# Patient Record
Sex: Female | Born: 1975 | Race: White | Hispanic: No | Marital: Married | Smoking: Current every day smoker
Health system: Southern US, Community
[De-identification: ages and names within clinical notes are randomized; demographics above are authoritative.]

## PROBLEM LIST (undated history)

## (undated) DIAGNOSIS — R519 Headache, unspecified: Secondary | ICD-10-CM

## (undated) DIAGNOSIS — K2970 Gastritis, unspecified, without bleeding: Secondary | ICD-10-CM

## (undated) DIAGNOSIS — R221 Localized swelling, mass and lump, neck: Secondary | ICD-10-CM

## (undated) DIAGNOSIS — R51 Headache: Secondary | ICD-10-CM

## (undated) HISTORY — PX: TUBAL LIGATION: SHX77

## (undated) HISTORY — PX: ABDOMINAL HYSTERECTOMY: SHX81

## (undated) HISTORY — PX: ENDOMETRIAL ABLATION: SHX621

## (undated) HISTORY — PX: BLADDER SUSPENSION: SHX72

---

## 1997-09-08 ENCOUNTER — Other Ambulatory Visit: Admission: RE | Admit: 1997-09-08 | Discharge: 1997-09-08 | Payer: Self-pay | Admitting: Obstetrics and Gynecology

## 1998-03-04 ENCOUNTER — Inpatient Hospital Stay (HOSPITAL_COMMUNITY): Admission: AD | Admit: 1998-03-04 | Discharge: 1998-03-07 | Payer: Self-pay | Admitting: Obstetrics and Gynecology

## 1998-04-19 ENCOUNTER — Other Ambulatory Visit: Admission: RE | Admit: 1998-04-19 | Discharge: 1998-04-19 | Payer: Self-pay | Admitting: Obstetrics and Gynecology

## 1999-05-29 ENCOUNTER — Other Ambulatory Visit: Admission: RE | Admit: 1999-05-29 | Discharge: 1999-05-29 | Payer: Self-pay | Admitting: Obstetrics and Gynecology

## 2000-06-17 ENCOUNTER — Other Ambulatory Visit: Admission: RE | Admit: 2000-06-17 | Discharge: 2000-06-17 | Payer: Self-pay | Admitting: Obstetrics and Gynecology

## 2000-10-19 ENCOUNTER — Inpatient Hospital Stay (HOSPITAL_COMMUNITY): Admission: AD | Admit: 2000-10-19 | Discharge: 2000-10-19 | Payer: Self-pay | Admitting: Obstetrics and Gynecology

## 2000-11-22 ENCOUNTER — Inpatient Hospital Stay (HOSPITAL_COMMUNITY): Admission: AD | Admit: 2000-11-22 | Discharge: 2000-11-22 | Payer: Self-pay | Admitting: Obstetrics and Gynecology

## 2000-12-04 ENCOUNTER — Inpatient Hospital Stay (HOSPITAL_COMMUNITY): Admission: AD | Admit: 2000-12-04 | Discharge: 2000-12-04 | Payer: Self-pay | Admitting: *Deleted

## 2000-12-07 ENCOUNTER — Inpatient Hospital Stay (HOSPITAL_COMMUNITY): Admission: AD | Admit: 2000-12-07 | Discharge: 2000-12-07 | Payer: Self-pay | Admitting: Obstetrics and Gynecology

## 2000-12-09 ENCOUNTER — Inpatient Hospital Stay (HOSPITAL_COMMUNITY): Admission: AD | Admit: 2000-12-09 | Discharge: 2000-12-11 | Payer: Self-pay | Admitting: Obstetrics & Gynecology

## 2001-01-08 ENCOUNTER — Other Ambulatory Visit: Admission: RE | Admit: 2001-01-08 | Discharge: 2001-01-08 | Payer: Self-pay | Admitting: Obstetrics and Gynecology

## 2001-09-16 ENCOUNTER — Emergency Department (HOSPITAL_COMMUNITY): Admission: EM | Admit: 2001-09-16 | Discharge: 2001-09-16 | Payer: Self-pay | Admitting: Internal Medicine

## 2002-02-22 ENCOUNTER — Emergency Department (HOSPITAL_COMMUNITY): Admission: EM | Admit: 2002-02-22 | Discharge: 2002-02-22 | Payer: Self-pay | Admitting: *Deleted

## 2002-06-30 ENCOUNTER — Other Ambulatory Visit: Admission: RE | Admit: 2002-06-30 | Discharge: 2002-06-30 | Payer: Self-pay | Admitting: Obstetrics and Gynecology

## 2006-01-14 ENCOUNTER — Encounter: Admission: RE | Admit: 2006-01-14 | Discharge: 2006-01-14 | Payer: Self-pay | Admitting: Obstetrics and Gynecology

## 2006-02-17 ENCOUNTER — Encounter (INDEPENDENT_AMBULATORY_CARE_PROVIDER_SITE_OTHER): Payer: Self-pay | Admitting: *Deleted

## 2006-02-17 ENCOUNTER — Ambulatory Visit (HOSPITAL_COMMUNITY): Admission: RE | Admit: 2006-02-17 | Discharge: 2006-02-17 | Payer: Self-pay | Admitting: Obstetrics and Gynecology

## 2006-05-26 ENCOUNTER — Emergency Department (HOSPITAL_COMMUNITY): Admission: EM | Admit: 2006-05-26 | Discharge: 2006-05-26 | Payer: Self-pay | Admitting: Emergency Medicine

## 2008-08-24 ENCOUNTER — Ambulatory Visit (HOSPITAL_COMMUNITY): Admission: RE | Admit: 2008-08-24 | Discharge: 2008-08-25 | Payer: Self-pay | Admitting: Obstetrics and Gynecology

## 2008-08-24 ENCOUNTER — Encounter (INDEPENDENT_AMBULATORY_CARE_PROVIDER_SITE_OTHER): Payer: Self-pay | Admitting: Obstetrics and Gynecology

## 2009-10-13 ENCOUNTER — Emergency Department (HOSPITAL_COMMUNITY): Admission: EM | Admit: 2009-10-13 | Discharge: 2009-10-13 | Payer: Self-pay | Admitting: Emergency Medicine

## 2010-01-12 ENCOUNTER — Ambulatory Visit (HOSPITAL_COMMUNITY): Admission: RE | Admit: 2010-01-12 | Discharge: 2010-01-12 | Payer: Self-pay | Admitting: Family Medicine

## 2010-01-12 ENCOUNTER — Encounter: Payer: Self-pay | Admitting: Gastroenterology

## 2010-02-26 ENCOUNTER — Encounter (INDEPENDENT_AMBULATORY_CARE_PROVIDER_SITE_OTHER): Payer: Self-pay | Admitting: *Deleted

## 2010-06-19 NOTE — Letter (Signed)
Summary: Unable to Reach, Consult Scheduled  [facility]  [address]   [place], [place] [address]   Phone: [phone]  Fax: [phone]    02/26/2010  [name] [address] [place], [place]  [address] 12/11/1975   Dear Ms. [name],   We have been unable to reach you by phone.  Please contact our office with an updated phone number.  At the recommendation of DR Gerda Diss  we have been asked to schedule you a consult with DR Jena Gauss for ABDOMINAL PAIN/GASTRITIS AND NAUSEA.  Please call our office at [phone].     Thank you,    Diana Eves  [facility] R. Roetta Sessions, M.D.    Jonette Eva, M.D. Lorenza Burton, FNP-BC    Tana Coast, PA-C Phone: [phone]    Fax: [phone]

## 2010-08-29 LAB — CBC
HCT: 37.4 % (ref 36.0–46.0)
Hemoglobin: 9.9 g/dL — ABNORMAL LOW (ref 12.0–15.0)
MCHC: 34 g/dL (ref 30.0–36.0)
MCHC: 34.8 g/dL (ref 30.0–36.0)
Platelets: 148 10*3/uL — ABNORMAL LOW (ref 150–400)
Platelets: 199 10*3/uL (ref 150–400)
RDW: 12 % (ref 11.5–15.5)
RDW: 12.6 % (ref 11.5–15.5)

## 2010-10-02 NOTE — Op Note (Signed)
[name], Fernandez             ACCOUNT NO.:  [id number]   MEDICAL RECORD NO.:  [id number]          PATIENT TYPE:  INP   LOCATION:  9317                          FACILITY:  [facility]   PHYSICIAN:  Guy Sandifer. Henderson Cloud, M.D. DATE OF BIRTH:  [birth year]   DATE OF PROCEDURE:  08/24/2008  DATE OF DISCHARGE:                               OPERATIVE REPORT   PREOPERATIVE DIAGNOSES:  1. Pelvic pain.  2. Stress urinary incontinence.   POSTOPERATIVE DIAGNOSES:  1. Pelvic pain.  2. Stress urinary incontinence.   PROCEDURE:  Laparoscopically-assisted vaginal hysterectomy and  transobturator mid urethral sling.   SURGEON:  Guy Sandifer. Henderson Cloud, MD   ASSISTANT:  Zelphia Cairo, MD   ANESTHESIA:  General with endotracheal intubation.   SPECIMENS:  Uterus and bilateral Filshie clips to Pathology.   ESTIMATED BLOOD LOSS:  100 mL.   INDICATIONS AND CONSENT:  This patient is a 34 year old married white  female G3, P2, status post tubal ligation with pelvic pain and stress  urinary incontinence.  Details are dictated in the history and physical.  Laparoscopically-assisted vaginal hysterectomy, removal of tube and  ovary if abnormal, and transobturator mid urethral sling has been  discussed preoperatively.  Potential risks and complications have been  discussed preoperatively including, but not limited to infection, organ  damage, bleeding requiring transfusion of blood products with HIV and  hepatitis acquisition, DVT, PE, pneumonia, fistula formation,  laparotomy.  Success and failure rates of the sling, recurrent urinary  incontinence, postoperative irritative voiding symptoms, pelvic pain,  dyspareunia, delayed healing, erosion, return to the operating room,  catheterization, prolonged self-catheterization have all been discussed  preoperatively.  All questions have been answered and consent was signed  on the chart.   FINDINGS:  Upper abdomen is grossly normal.  Uterus is about 6 weeks in  size, smooth and contour.  Anterior and posterior cul-de-sacs are  normal.  Ovaries are normal bilaterally.  Fallopian tubes status post  ligation with Filshie clips.  Both Filshie clips are noted.   PROCEDURE IN DETAIL:  The patient was taken to the operating room where  she was identified, placed in dorsosupine position and general  anesthesia was induced via endotracheal intubation.  She was then placed  in the dorsal lithotomy position where she was prepped abdominally and  vaginally.  Bladder straight catheterized.  The anterior cervical lip  was grasped with single-tooth tenaculum.  The Hulka tenaculum will not  pass through the internal cervical os.  Therefore, the single-tooth  tenaculum was left in place.  The patient was draped in a sterile  fashion.  Time-out was carried out.  The infraumbilical and suprapubic  areas and later the left lower quadrant was infiltrated with 1% plain  Xylocaine.  A small infraumbilical incision was made and a disposable  Veress needle was placed on the first attempt without difficulty.  Placement was verified with a syringe and drop test.  Two liters of gas  were then insufflated under low pressure with good tympany in the right  upper quadrant.  Veress needle was removed.  A 10/11 Xcel bladeless  disposable trocar sleeve  was placed using direct visualization with the  diagnostic laparoscope.  After placement, the operative laparoscope was  used.  The suprapubic incision and later the left lower quadrant  incisions were made after careful transillumination.  A 5-mm Xcel  bladeless disposable trocar sleeves were both placed under direct  visualization without difficulty.  The above findings were noted.  Using  a grasper to manipulate the uterine fundus and a second grasper to  manipulate the adnexa, the Filshie clips were easily removed using the  EnSeal bipolar cautery cutting device.  They were delivered separately  and sent to Pathology.  The  proximal ligaments were then taken down  incorporating the proximal segment of the fallopian tube bilaterally and  it was carried across the utero-ovarian ligament, round ligament down to  the level of the vesicouterine peritoneum bilaterally.  Good hemostasis  was maintained.  A small incision was made in the center portion of the  vesicouterine peritoneum.  The suprapubic and left lower quadrant trocar  sleeves were removed.  The instruments were removed and attention was  turned to the pelvis.  Posterior cul-de-sacs entered sharply and the  cervix was circumscribed with unipolar cautery.  Mucosa was advanced  sharply and bluntly.  Then using the LigaSure bipolar cautery device,  the uterosacral ligaments were taken down, followed by the bladder  pillars, the cardinal ligaments, and uterine vessels.  The anterior cul-  de-sac was entered without difficulty.  Specimens delivered without  difficulty.  All suture will be 0 Monocryl unless otherwise designated.  Uterosacral ligaments were plicated vaginal cuff bilaterally.  They were  then plicated in the midline with a third suture.  Cuff was closed with  figure-of-eights.  The mid urethral sling was then undertaken.  The  points for the obturator incision were marked bilaterally after careful  palpation with a marking pen.  These areas were injected with 1% plain  Xylocaine.  Foley catheter was placed.  The bladder was drained and the  catheter was left indwelling.  The infraumbilical portion of the vaginal  mucosa was also injected with the same solution.  A small infraumbilical  incision was made and dissection was carried out bilaterally to the  urogenital diaphragm.  Then using the Halo needles from the Obtryx kit,  they were passed bilaterally through the obturator foramen with the  passage of the needle tip guided with the examining finger.  Both were  exited below the urethra.  Foley catheter was then removed and  cystoscopy was  carried out with 70-degree cystoscope.  A 360-degree  inspection reveals no evidence of perforation, no foreign body.  A good  puff of urine was noted bilaterally from the ureters.  Cystoscope was  removed.  Foley catheter was replaced.  The bladder was drained and the  catheter was left in place.  The polypropylene mesh sling was then  placed on the needle tips and withdrawn through the incisions  bilaterally.  Sheath was removed intact.  Tension was adjusted so that  the sling is laying perfectly flat.  There was no tension on the  suburethral portion of the sling and a pickups placed below the sling  can easily be rotated perpendicular to the floor with zero tension.  The  vaginal mucosa was closed in running locking fashion with 2-0 Vicryl  suture.  Excess sling was trimmed at the level of the skin and this  incision to close with Dermabond.  Attention was returned to the  abdomen.  Pneumoperitoneum was reintroduced and the suprapubic trocar  sleeve was reintroduced under direct visualization.  Irrigation was  carried out and careful inspection reveals good hemostasis all around.  Inspection under reduced pneumoperitoneum confirms this.  Excess fluid  was removed.  An additional 10 mL of 1% plain Xylocaine was then  instilled into the peritoneal cavity.  All instruments were removed.  The skin incisions were closed with subcuticular 3-0 Vicryl suture on  the umbilical and suprapubic incisions.  All 3 incisions were closed  with Dermabond.  All counts were correct.  The patient was awake and  taken to the recovery room in stable condition.       Guy Sandifer Henderson Cloud, M.D.  Electronically Signed    JET/MEDQ  D:  08/24/2008  T:  08/24/2008  Job:  [id number]

## 2010-10-02 NOTE — Discharge Summary (Signed)
NAMECINDI, GHAZARIAN             ACCOUNT NO.:  [id number]   MEDICAL RECORD NO.:  [id number]          PATIENT TYPE:  INP   LOCATION:  9317                          [facility]   PHYSICIAN:  Guy Sandifer. Henderson Cloud, M.D. DATE OF BIRTH:  1976/04/09   DATE OF ADMISSION:  08/24/2008  DATE OF DISCHARGE:  08/25/2008                               DISCHARGE SUMMARY   ADMITTING DIAGNOSES:  1. Pelvic pain.  2. Stress urinary incontinence.   DISCHARGE DIAGNOSES:  1. Pelvic pain.  2. Stress urinary incontinence.   PROCEDURES:  On August 24, 2008, laparoscopically assisted vaginal  hysterectomy and transobturator midurethral sling.   REASON FOR ADMISSION:  This patient is 35 year old married white female  G3, P2, status post tubal ligation with increasing pelvic pain and  symptoms of stress incontinence.  Details dictated in the history and  physical.  She is admitted for surgical management.   HOSPITAL COURSE:  The patient is admitted to the hospital, undergoes the  above procedure.  That evening, she has some hip pain, but otherwise no  real pelvic pain.  She is tolerating regular diet and ambulating well.  Vital signs are stable.  She is afebrile with clear urine output.  On  morning of the first postoperative day, she continues to ambulate well,  tolerating regular diet.  She remains afebrile with stable vital signs.  She voids 400 mL with 0 mL residual.  Hemoglobin is 9.9.  Abdomen is  soft with good bowel sounds.   CONDITION ON DISCHARGE:  Good.   DIET:  Regular as tolerated.   ACTIVITY:  No lifting, no operation of automobiles, and no vaginal  entry.  She is to call the office for problems including, not limited to  temperature of 101 degrees, heavy vaginal bleeding, increasing pain, or  persistent nausea, vomiting.   MEDICATIONS:  1. Percocet 5/325 mg #40 one to two p.o. q.6 h. p.r.n.  2. Ibuprofen 600 mg q.6 h. p.r.n.  3. Tandem iron supplement #30, 1 p.o. daily.   FOLLOWUP:   In the office in 2 weeks.      Guy Sandifer Henderson Cloud, M.D.  Electronically Signed     JET/MEDQ  D:  08/25/2008  T:  08/26/2008  Job:  [id number]

## 2010-10-05 NOTE — H&P (Signed)
NAMEMATRICIA, BEGNAUD             ACCOUNT NO.:  [id number]   MEDICAL RECORD NO.:  [id number]          PATIENT TYPE:  AMB   LOCATION:  SDC                           [facility]   PHYSICIAN:  Guy Sandifer. Henderson Cloud, M.D. DATE OF BIRTH:  March 23, 1976   DATE OF ADMISSION:  DATE OF DISCHARGE:                                HISTORY & PHYSICAL   CHIEF COMPLAINT:  Heavy painful menses and desires permanent sterilization.   HISTORY OF PRESENT ILLNESS:  This patient is a 35 year old, married, white  female, G3, P2 who is having menses every 22 to 27 days.  She is passing  golf ball size clots for 4 days at a time.  Changes tampons and pads every 1-  2 hours.  She is also having increasingly heavy cramping with this as well.  Finally, she wants permanent sterilization.  After careful discussion of the  options, she is being admitted for laparoscopy with tubal ligation,  hysteroscopy, D&C, and endometrial ablation.  Potential risks and  complications, success, and failure rates have been discussed  preoperatively.   PAST MEDICAL HISTORY:  Fractured femur as a child.   PAST SURGICAL HISTORY:  Negative.   OBSTETRIC HISTORY:  Miscarriage x1.  Vaginal delivery x2.   FAMILY HISTORY:  Heart disease in paternal grandfather.  Hypertension  paternal grandfather.  Varicosities paternal grandmother.  Diabetes father,  paternal grandmother, and maternal uncle.  Gestational diabetes in sister.  Colon and uterine cancer in maternal grandmother.   MEDICATIONS:  Over-the-counter allergy medications.   ALLERGIES:  No known drug allergies.   SOCIAL HISTORY:  Tobacco, smokes half a pack a day.  Denies alcohol or drug  abuse.   REVIEW OF SYSTEMS:  NEURO:  Has occasional headache.  CARDIAC:  Denies chest pain.  PULMONARY:  Denies shortness of breath.  GI:  Denies recent changes in bowel habits.   PHYSICAL EXAMINATION:  VITAL SIGNS:  Height 5 feet 4 inches, weight 123-1/2  pounds, blood pressure 114/72.  HEENT:  Without thyromegaly.  LUNGS:  Clear to auscultation.  HEART:  Regular rate and rhythm.  BACK:  No CVA tenderness.  BREASTS:  Without mass, traction, or discharge.  ABDOMEN:  Soft and nontender without masses.  PELVIC: __________  cervix without lesion.  Uterus retroverted, upper normal  size,  __________  adnexa nontender without masses.  EXTREMITIES:  Grossly within normal limits.  NEUROLOGICAL:  Grossly within normal limits.   ASSESSMENT:  Dysmenorrhea, metrorrhagia, desires permanent stabilization.   PLAN:  Laparoscopy with bilateral tubal ligation, hysteroscopy, D&C,  endometrial ablation.      Guy Sandifer Henderson Cloud, M.D.  Electronically Signed     JET/MEDQ  D:  02/13/2006  T:  02/13/2006  Job:  [id number]

## 2010-10-05 NOTE — H&P (Signed)
NAMEJULLIE, [name]                  ACCOUNT NO.:  [id number]   MEDICAL RECORD NO.:  [id number]          PATIENT TYPE:  AMB   LOCATION:                                [facility]   PHYSICIAN:  Guy Sandifer. Henderson Cloud, M.D. DATE OF BIRTH:  1976/01/07   DATE OF ADMISSION:  08/24/2008  DATE OF DISCHARGE:                              HISTORY & PHYSICAL   This Dr., and healing   CHIEF COMPLAINT:  Pelvic pain and leaking urine.   HISTORY OF PRESENT ILLNESS:  This patient is a 35 year old married white  female G3, P2 status post tubal ligation, status post endometrial  ablation with known endometriosis who has daily pelvic pain and monthly  exacerbations.  She also has bilateral low back pain with this.  She is  unable to have intercourse secondary to discomfort.  She also leaks  urine with coughing and laughing.  Ultrasound in my office on June 30, 2008, revealed uterus measuring 6.6 x 2.6 x 4.6 cm.  There is a  fluid collection probably secondary to hematocolpos  status post  ablation.  Urodynamics is consistent with stress urinary incontinence.  After discussion of options, she is being admitted for laparoscopically-  assisted vaginal hysterectomy and mid urethral sling.  Potential risks  and complications have been discussed preoperatively.   PAST MEDICAL HISTORY:  1. Fractured femur as a child.  2. Endometriosis.   PAST SURGICAL HISTORY:  Laparoscopies for endometriosis and endometrial  ablation.   OBSTETRIC HISTORY:  Miscarriage x1, vaginal delivery x2.   FAMILY HISTORY:  Heart disease in paternal grandfather, hypertension in  paternal grandfather, varicosities in paternal grandmother, diabetes in  father, paternal grandmother, maternal uncle, gestational diabetes in  sister, colon and uterine cancer in maternal grandmother.   MEDICATIONS:  Clarinex and Vicodin p.r.n.   ALLERGIES:  No known drug allergies.   SOCIAL HISTORY:  Denies tobacco, alcohol, or drug abuse.   REVIEW OF SYSTEMS:  NEURO:  Denies headache.  CARDIAC:  Denies chest  pain.  PULMONARY:  Denies shortness of breath.   PHYSICAL EXAMINATION:  VITAL SIGNS:  Height 5 feet 4-1/2 inches, weight  158 pounds, blood pressure 100/70.  HEENT:  Without thyromegaly.  LUNGS:  Clear to auscultation.  HEART:  Regular rate and rhythm.  BACK:  Without CVA tenderness.  ABDOMEN:  Soft, nontender without masses.  PELVIC:  Vulva, vagina, and cervix without lesion.  Uterus is  retroverted, tender over the left uterosacral ligament.  Left adnexa  without palpable masses.  Right adnexa nontender without masses.  EXTREMITIES:  Grossly within normal limits.  NEUROLOGIC:  Grossly within normal limits.   ASSESSMENT:  Pelvic pain and stress incontinence.   PLAN:  Laparoscopically-assisted vaginal hysterectomy and mid urethral  sling.      Guy Sandifer Henderson Cloud, M.D.  Electronically Signed     JET/MEDQ  D:  08/05/2008  T:  08/06/2008  Job:  [id number]

## 2010-10-05 NOTE — Op Note (Signed)
[name], [name]             ACCOUNT NO.:  [id number]   MEDICAL RECORD NO.:  [id number]          PATIENT TYPE:  AMB   LOCATION:  SDC                           [facility]   PHYSICIAN:  Guy Sandifer. Henderson Cloud, M.D. DATE OF BIRTH:  25-Mar-1976   DATE OF PROCEDURE:  02/17/2006  DATE OF DISCHARGE:                                 OPERATIVE REPORT   PREOPERATIVE DIAGNOSIS:  1. Dysmenorrhea.  2. Menorrhagia.  3. Desires permanent sterilization.   POSTOPERATIVE DIAGNOSIS:  1. Menorrhagia.  2. Desires permanent sterilization.  3. Endometriosis.   PROCEDURE:  1. Hysteroscopy with dilatation curettage.  2. NovaSure endometrial ablation.  3. Laparoscopy with bilateral tubal ligation with Filshie clips.  4. Ablation of endometriosis.  5. A 1% Xylocaine paracervical block.   SURGEON:  Guy Sandifer. Henderson Cloud, M.D.   ANESTHESIA:  General with endotracheal intubation by Belva Agee, M.D.   ESTIMATED BLOOD LOSS:  Minimal.   SPECIMEN:  Endometrial curettings to pathology.   INPUTS AND OUTPUTS:  Sorbitol distending media 150 mL deficit with some of  that being on the floor.   INDICATIONS AND CONSENT:  This patient is A 35 year old married white female  G3, P2 with increasingly heavy painful menses who desires permanent  sterilization.  Details have been dictated on the history and physical.  Laparoscopy with tubal ligation, hysteroscopy, D&C, and endometrial ablation  have been discussed with the patient preoperatively.  The potential risks  and complications have been reviewed preoperatively; including, but limited  to infection, uterine perforation, organ damage, bleeding requiring  transfusion of blood products with possible transfusion reaction, HIV and  hepatitis acquisition, DVT, PE, pneumonia.  The permanence of tubal  ligation, alternative methods of contraception, failure rate, and increased  ectopic risk have been reviewed preoperatively.  Success and failure rates  of  endometrial ablation have also been reviewed preoperatively.  All  questions have been answered and consent is on the chart.   FINDINGS:  Endometrial canal is without abnormal lesion.  Both fallopian  tube ostia are identified.  Abdominally upper abdomen is grossly normal.  Appendix was normal.  Anterior cul-de-sac is normal.  Tubes and ovaries are  normal bilaterally.  There is a dark, black implant of endometriosis on the  midportion the right uterosacral ligament.  There is a light black implant  in the upper center of the posterior cul-de-sac; and there are small white  patches along the left uterosacral ligament.   DESCRIPTION OF PROCEDURE:  The patient is taken to operating room where she  is identified, placed in dorsal supine position and general anesthesia is  induced via endotracheal intubation.  She is then placed in the dorsal  lithotomy position where she is prepped, abdominally and vaginally, bladder  straight catheterized; and she is draped in a sterile fashion.  A bivalve  speculum is placed in the vagina; and the anterior cervical lip is injected  with 1% Xylocaine and grasped with a single-tooth tenaculum.  Paracervical  block is placed in the 2, 4, 5, 7, 8, and 10 o'clock positions with  approximately 20 mL total of  1% plain Xylocaine.  The uterus sounds to 8 cm.  Endocervical canal measures to 4-5 cm.   The cervix is dilated with the 27 dilator.  The diagnostic hysteroscope is  then placed in the endocervical canal and advanced under direct  visualization using sorbitol distending media.  The above findings were  noted.  Hysteroscope was withdrawn.  Sharp curettage is carried out.  The  cervix is then dilated to a 31 dilator.  The NovaSure device is then placed  in the endometrial canal.  After passing the cavity test, on the first  attempt, endometrial ablation is carried out.  The device is then withdrawn.  Inspection reveals the device to be intact.  The  hysteroscope was then  reinserted in the endocervical canal and passed under direct visualization  using sorbitol distending media.  There is no evidence of perforation.  It  appears that there is an excellent cautery at the entire endometrial cavity.  Hysteroscope is withdrawn and a single-tooth tenaculum is replaced with the  Hulka tenaculum.   Attention is then turned to the abdomen.  The infraumbilical and suprapubic  areas are injected in the midline with 1/2% plain Marcaine.  An  infraumbilical incision is made.  A disposable Veress needle is then placed  on the first attempt without difficulty.  Syringe and drop test are normal.  Then 2 liters of gas were insufflated under low pressure with good tympany  in the right upper quadrant.  Veress needle is removed.  The 10/11  disposable bladeless Essure trocar is then placed using direct visualization  with the diagnostic laparoscopic.  After placement, it is changed to the  operative laparoscopic.   A small suprapubic incision is made and the 5 mm Essure bladeless disposable  trocar sleeve is placed under direct visualization without difficulty.  The  above findings are noted.  The course of the ureters is identified  bilaterally.  The areas of endometriosis are ablated with bipolar cautery.  Filshie clips are then placed on the proximal one-third of the fallopian  tubes bilaterally.  After removing the Filshie clip applicator, inspection  reveals good application of the tubes bilaterally.  Full-thickness of the  tube is within the clip.  The heel of the clip can be seen through the  mesosalpinx bilaterally.  Suprapubic trocar sleeve is removed and good  hemostasis is noted.  Pneumoperitoneum is reduced; and the umbilical trocar  sleeve is removed.  A 3-0 Vicryl suture is used in the subcuticular fascia  and to close the suprapubic incision.  Dermabond is placed on both incisions.  Hulka tenaculum is removed and no bleeding is noted.   All counts  are correct.  The patient is awakened, and taken to recovery room in stable  condition.      Guy Sandifer Henderson Cloud, M.D.  Electronically Signed     JET/MEDQ  D:  02/17/2006  T:  02/17/2006  Job:  [id number]

## 2010-10-21 ENCOUNTER — Emergency Department (HOSPITAL_COMMUNITY)
Admission: EM | Admit: 2010-10-21 | Discharge: 2010-10-21 | Disposition: A | Discharge status: Home / Self Care | Payer: No Typology Code available for payment source | Attending: Emergency Medicine | Admitting: Emergency Medicine

## 2010-10-21 DIAGNOSIS — Y998 Other external cause status: Secondary | ICD-10-CM | POA: Insufficient documentation

## 2010-10-21 DIAGNOSIS — M542 Cervicalgia: Secondary | ICD-10-CM | POA: Insufficient documentation

## 2010-10-21 DIAGNOSIS — F17200 Nicotine dependence, unspecified, uncomplicated: Secondary | ICD-10-CM | POA: Insufficient documentation

## 2010-10-21 DIAGNOSIS — M549 Dorsalgia, unspecified: Secondary | ICD-10-CM | POA: Insufficient documentation

## 2011-09-20 ENCOUNTER — Other Ambulatory Visit: Payer: Self-pay | Admitting: Family Medicine

## 2011-09-20 ENCOUNTER — Ambulatory Visit (HOSPITAL_COMMUNITY)
Admission: RE | Admit: 2011-09-20 | Discharge: 2011-09-20 | Disposition: A | Discharge status: Home / Self Care | Payer: BC Managed Care – PPO | Source: Ambulatory Visit | Attending: Family Medicine | Admitting: Family Medicine

## 2011-09-20 DIAGNOSIS — R52 Pain, unspecified: Secondary | ICD-10-CM

## 2011-09-20 DIAGNOSIS — M25529 Pain in unspecified elbow: Secondary | ICD-10-CM | POA: Insufficient documentation

## 2011-09-20 DIAGNOSIS — M25539 Pain in unspecified wrist: Secondary | ICD-10-CM | POA: Insufficient documentation

## 2013-04-06 ENCOUNTER — Ambulatory Visit (INDEPENDENT_AMBULATORY_CARE_PROVIDER_SITE_OTHER): Payer: BC Managed Care – PPO | Admitting: Family Medicine

## 2013-04-06 ENCOUNTER — Encounter: Payer: Self-pay | Admitting: Family Medicine

## 2013-04-06 VITALS — BP 122/70 | Temp 97.9°F | Ht 64.0 in | Wt 141.0 lb

## 2013-04-06 DIAGNOSIS — Z79899 Other long term (current) drug therapy: Secondary | ICD-10-CM

## 2013-04-06 DIAGNOSIS — R319 Hematuria, unspecified: Secondary | ICD-10-CM

## 2013-04-06 LAB — POCT URINALYSIS DIPSTICK
Spec Grav, UA: <=1.005
pH, UA: 6

## 2013-04-06 MED ORDER — CIPROFLOXACIN HCL 500 MG PO TABS: 500.0000 mg | ORAL_TABLET | Freq: Two times a day (BID) | ORAL | Status: DC

## 2013-04-06 NOTE — Progress Notes (Signed)
  Subjective:    Patient ID: [name], female    DOB: Feb 09, 1976, 37 y.o.   MRN: [id number]  HPINoticed blood in urine 3 different days. Pain with urination. No fever, no abd pain, no back pain.  She has a vaginal mesh. First (10 days ago) painful urination then blood(bright red at the end) then Weds dark urine with blood then this am urination with red blood at the end of the urination  Bladder sling ( Dr Henderson Cloud ) Had hysterectomy in 2009 Remote hx of UTI No hx kidney stone Fam hx kidney stone,    Review of Systems No fever no V No abd pain    Objective:   Physical Exam  Vitals reviewed. Constitutional: She appears well-developed and well-nourished.  HENT:  Head: Normocephalic.  Neck: Neck supple.  Cardiovascular: Normal rate, regular rhythm and normal heart sounds.   No murmur heard. Pulmonary/Chest: Effort normal and breath sounds normal. No respiratory distress. She has no wheezes.  Abdominal: Soft.  Lymphadenopathy:    She has no cervical adenopathy.          Assessment & Plan:  #1 hematuria-urine today has 2-3 WBCs per HPF. I don't find any evidence of any gross hematuria. I would recommend culturing it. Antibiotic for 10 days, if progressive symptoms or if worse to followup  If urine culture is negative I would recommend a CT scan with contrast to look at kidneys and collecting duct and also referral to urology for cystoscope. Patient states she understood. If the urine culture is positive finish antibiotics followup in a couple weeks for recheck of urine. Patient was told that if she has ongoing troubles with blood in the urine then it would also need urology workup

## 2013-04-09 LAB — URINE CULTURE: Colony Count: 15000

## 2013-04-13 ENCOUNTER — Other Ambulatory Visit: Payer: Self-pay | Admitting: Family Medicine

## 2013-04-13 DIAGNOSIS — R319 Hematuria, unspecified: Secondary | ICD-10-CM

## 2013-04-19 NOTE — Addendum Note (Signed)
Addended by: Metro Kung on: 04/19/2013 10:31 AM   Modules accepted: Orders

## 2013-04-27 ENCOUNTER — Other Ambulatory Visit (HOSPITAL_COMMUNITY): Payer: BC Managed Care – PPO

## 2013-04-27 ENCOUNTER — Encounter (HOSPITAL_COMMUNITY): Payer: Self-pay

## 2013-04-27 ENCOUNTER — Ambulatory Visit (HOSPITAL_COMMUNITY)
Admission: RE | Admit: 2013-04-27 | Discharge: 2013-04-27 | Disposition: A | Discharge status: Home / Self Care | Payer: BC Managed Care – PPO | Source: Ambulatory Visit | Attending: Family Medicine | Admitting: Family Medicine

## 2013-04-27 DIAGNOSIS — N200 Calculus of kidney: Secondary | ICD-10-CM | POA: Insufficient documentation

## 2013-04-27 DIAGNOSIS — Q638 Other specified congenital malformations of kidney: Secondary | ICD-10-CM | POA: Insufficient documentation

## 2013-04-27 DIAGNOSIS — R319 Hematuria, unspecified: Secondary | ICD-10-CM | POA: Insufficient documentation

## 2013-04-27 DIAGNOSIS — N2889 Other specified disorders of kidney and ureter: Secondary | ICD-10-CM | POA: Insufficient documentation

## 2013-04-27 MED ORDER — SODIUM CHLORIDE 0.9 % IJ SOLN
INTRAMUSCULAR | Status: AC
  Filled 2013-04-27: qty 500

## 2013-04-27 MED ORDER — IOHEXOL 300 MG/ML  SOLN
150.0000 mL | Freq: Once | INTRAMUSCULAR | Status: AC | PRN
  Administered 2013-04-27: 150 mL via INTRAVENOUS

## 2013-04-27 MED ORDER — SODIUM CHLORIDE 0.9 % IV SOLN
INTRAVENOUS | Status: AC
  Filled 2013-04-27: qty 250

## 2013-04-27 NOTE — Progress Notes (Signed)
Notified patient.  Patient verbalized understanding. 

## 2013-04-30 ENCOUNTER — Encounter: Payer: Self-pay | Admitting: Family Medicine

## 2013-07-13 ENCOUNTER — Ambulatory Visit (INDEPENDENT_AMBULATORY_CARE_PROVIDER_SITE_OTHER): Payer: BC Managed Care – PPO | Admitting: Family Medicine

## 2013-07-13 ENCOUNTER — Encounter: Payer: Self-pay | Admitting: Family Medicine

## 2013-07-13 VITALS — BP 112/68 | Ht 65.0 in | Wt 140.0 lb

## 2013-07-13 DIAGNOSIS — M542 Cervicalgia: Secondary | ICD-10-CM

## 2013-07-13 MED ORDER — CHLORZOXAZONE 500 MG PO TABS: 500.0000 mg | ORAL_TABLET | Freq: Three times a day (TID) | ORAL | Status: DC | PRN

## 2013-07-13 MED ORDER — DICLOFENAC SODIUM 75 MG PO TBEC: 75.0000 mg | DELAYED_RELEASE_TABLET | Freq: Two times a day (BID) | ORAL | Status: DC

## 2013-07-13 NOTE — Progress Notes (Signed)
   Subjective:    Patient ID: [name], female    DOB: 01/07/1976, 38 y.o.   MRN: [id number]  Neck Pain  This is a new problem. The current episode started 1 to 4 weeks ago. The pain is present in the left side and right side. The quality of the pain is described as shooting. She has tried NSAIDs, heat and ice (biofreeze) for the symptoms. The treatment provided mild relief.   Started out as a know left post cervical pain and tend  Rad to shoulder  Feels tight and spasms at times  Tried advil hot and cold compresses and biofreeze  Radiates at times down into the hand  Staying more active with exercise Review of Systems  Musculoskeletal: Positive for neck pain.   no chest pain no back pain no abdominal pain ROS otherwise     Objective:   Physical Exam Alert mild malaise. Lateral neck tender to palpation. Distal arm strength sensation intact. Lungs clear. Heart regular in rhythm.       Assessment & Plan:  Impression paracervical strain with element of spasm plan local measures discussed. Muscle spasm medicine prescribed. Anti-inflammatory medicine prescribed. Warning signs discussed. WSL

## 2013-11-22 ENCOUNTER — Encounter: Payer: Self-pay | Admitting: Nurse Practitioner

## 2013-11-22 ENCOUNTER — Ambulatory Visit (INDEPENDENT_AMBULATORY_CARE_PROVIDER_SITE_OTHER): Payer: BC Managed Care – PPO | Admitting: Nurse Practitioner

## 2013-11-22 VITALS — BP 112/80 | Ht 65.0 in | Wt 137.0 lb

## 2013-11-22 DIAGNOSIS — L259 Unspecified contact dermatitis, unspecified cause: Secondary | ICD-10-CM

## 2013-11-22 MED ORDER — PREDNISONE 20 MG PO TABS: ORAL_TABLET | ORAL | Status: DC

## 2013-11-22 MED ORDER — CLOBETASOL PROPIONATE 0.05 % EX CREA: 1.0000 "application " | TOPICAL_CREAM | Freq: Two times a day (BID) | CUTANEOUS | Status: DC

## 2013-11-22 MED ORDER — METHYLPREDNISOLONE ACETATE 40 MG/ML IJ SUSP
40.0000 mg | Freq: Once | INTRAMUSCULAR | Status: AC
  Administered 2013-11-22: 40 mg via INTRAMUSCULAR

## 2013-11-22 NOTE — Patient Instructions (Signed)
claritin 10 mg in the morning Benadryl 25 at night

## 2013-11-24 ENCOUNTER — Encounter: Payer: Self-pay | Admitting: Nurse Practitioner

## 2013-11-24 NOTE — Progress Notes (Signed)
Subjective:  Presents complaints of possible poison oak rash that began several days ago. Started on her arms and legs, has spread to other areas of her body. Very pruritic. No fever. Used steroid cream that she has at home, minimal relief. No cough or wheezing.  Objective:   BP 112/80  Ht 5\' 5"  (1.651 m)  Wt 137 lb (62.143 kg)  BMI 22.80 kg/m2 NAD. Alert, oriented. Lungs clear. Heart regular rate rhythm. Several scattered patches of pink slightly raised papular/vesicular rash noted particularly on the arms and legs with a few scattered lesions around the abdomen.  Assessment: Contact dermatitis - Plan: methylPREDNISolone acetate (DEPO-MEDROL) injection 40 mg  Plan:  Meds ordered this encounter  Medications  . methylPREDNISolone acetate (DEPO-MEDROL) injection 40 mg    Sig:   . clobetasol cream (TEMOVATE) 0.05 %    Sig: Apply 1 application topically 2 (two) times daily.    Dispense:  30 g    Refill:  0    Order Specific Question:  Supervising Provider    Answer:  Mikey Kirschner [[id number]]  . predniSONE (DELTASONE) 20 MG tablet    Sig: 2 po qd x 5 d; start 7/7 am    Dispense:  10 tablet    Refill:  0    Order Specific Question:  Supervising Provider    Answer:  Mikey Kirschner [[id number]]   Claritin 10 mg in the morning, Benadryl 25 mg at nighttime. Call back if worsens or persists.

## 2014-06-08 ENCOUNTER — Other Ambulatory Visit: Payer: Self-pay | Admitting: Obstetrics and Gynecology

## 2014-06-08 DIAGNOSIS — N644 Mastodynia: Secondary | ICD-10-CM

## 2014-06-15 ENCOUNTER — Ambulatory Visit
Admission: RE | Admit: 2014-06-15 | Discharge: 2014-06-15 | Disposition: A | Discharge status: Home / Self Care | Payer: BLUE CROSS/BLUE SHIELD | Source: Ambulatory Visit | Attending: Obstetrics and Gynecology | Admitting: Obstetrics and Gynecology

## 2014-06-15 DIAGNOSIS — N644 Mastodynia: Secondary | ICD-10-CM

## 2014-06-22 ENCOUNTER — Other Ambulatory Visit: Payer: Self-pay | Admitting: Obstetrics and Gynecology

## 2014-06-24 LAB — CYTOLOGY - PAP

## 2014-09-16 ENCOUNTER — Ambulatory Visit (INDEPENDENT_AMBULATORY_CARE_PROVIDER_SITE_OTHER): Payer: 59 | Admitting: Family Medicine

## 2014-09-16 ENCOUNTER — Ambulatory Visit (HOSPITAL_COMMUNITY)
Admission: RE | Admit: 2014-09-16 | Discharge: 2014-09-16 | Disposition: A | Discharge status: Home / Self Care | Payer: 59 | Source: Ambulatory Visit | Attending: Family Medicine | Admitting: Family Medicine

## 2014-09-16 ENCOUNTER — Encounter: Payer: Self-pay | Admitting: Family Medicine

## 2014-09-16 VITALS — BP 118/78 | Ht 65.0 in | Wt 143.8 lb

## 2014-09-16 DIAGNOSIS — M25571 Pain in right ankle and joints of right foot: Secondary | ICD-10-CM | POA: Diagnosis not present

## 2014-09-16 MED ORDER — HYDROCODONE-ACETAMINOPHEN 5-325 MG PO TABS: 1.0000 | ORAL_TABLET | Freq: Every evening | ORAL | Status: DC | PRN

## 2014-09-16 MED ORDER — DICLOFENAC SODIUM 75 MG PO TBEC: 75.0000 mg | DELAYED_RELEASE_TABLET | Freq: Two times a day (BID) | ORAL | Status: DC

## 2014-09-16 NOTE — Progress Notes (Signed)
   Subjective:    Patient ID: [name], female    DOB: 16-Nov-1975, 39 y.o.   MRN: [id number]  HPI  Patient arrives with complaint of right ankle pain s/p fall. This occurred several days ago.  Patient continues to have pain when walking on. Primarily the right lateral side of the ankle. Also some pain in the hip and elbow but that is nearly gone.  Still limping.  Very tender at site of injury  otc advil taking a nexck antiinflam   Review of Systems No back pain no chest pain no abdominal pain    Objective:   Physical Exam  Alert vitals stable HET normal lungs clear heart regular in rhythm lateral ankle tenderness palpation along the malleolus moderate edema some abrasion noted      Assessment & Plan:  Impression soft tissue injury versus chip fracture malleolus discussed plan anti-inflammatory medicine prescribed. Local measures discussed hydrocodone when necessary. X-ray involved ankle WSL

## 2015-09-09 ENCOUNTER — Encounter (HOSPITAL_COMMUNITY): Payer: Self-pay

## 2015-09-09 ENCOUNTER — Emergency Department (HOSPITAL_COMMUNITY)
Admission: EM | Admit: 2015-09-09 | Discharge: 2015-09-10 | Disposition: A | Discharge status: Home / Self Care | Payer: 59 | Attending: Emergency Medicine | Admitting: Emergency Medicine

## 2015-09-09 DIAGNOSIS — T7840XA Allergy, unspecified, initial encounter: Secondary | ICD-10-CM

## 2015-09-09 DIAGNOSIS — F17200 Nicotine dependence, unspecified, uncomplicated: Secondary | ICD-10-CM | POA: Insufficient documentation

## 2015-09-09 DIAGNOSIS — T781XXA Other adverse food reactions, not elsewhere classified, initial encounter: Secondary | ICD-10-CM | POA: Diagnosis not present

## 2015-09-09 DIAGNOSIS — Z91013 Allergy to seafood: Secondary | ICD-10-CM | POA: Diagnosis not present

## 2015-09-09 MED ORDER — DIPHENHYDRAMINE HCL 25 MG PO CAPS
25.0000 mg | ORAL_CAPSULE | Freq: Once | ORAL | Status: AC
  Administered 2015-09-09: 25 mg via ORAL
  Filled 2015-09-09: qty 1

## 2015-09-09 MED ORDER — PREDNISONE 50 MG PO TABS
60.0000 mg | ORAL_TABLET | Freq: Once | ORAL | Status: AC
  Administered 2015-09-09: 60 mg via ORAL
  Filled 2015-09-09: qty 1

## 2015-09-09 MED ORDER — FAMOTIDINE 20 MG PO TABS
20.0000 mg | ORAL_TABLET | Freq: Once | ORAL | Status: AC
  Administered 2015-09-09: 20 mg via ORAL
  Filled 2015-09-09: qty 1

## 2015-09-09 NOTE — ED Notes (Addendum)
My tongue and lips started tingling after leaving the restaurant; my tongue started swelling.  I took two benadryl tabs when I got home.  Swelling in tongue went down, but started coming back. Ate shellfish.

## 2015-09-09 NOTE — ED Notes (Signed)
Throat is tight, is not hard to breathe.

## 2015-09-10 MED ORDER — PREDNISONE 20 MG PO TABS: 60.0000 mg | ORAL_TABLET | Freq: Every day | ORAL | Status: DC

## 2015-09-10 MED ORDER — FAMOTIDINE 20 MG PO TABS: 20.0000 mg | ORAL_TABLET | Freq: Every day | ORAL | Status: AC

## 2015-09-10 MED ORDER — DIPHENHYDRAMINE HCL 25 MG PO CAPS: 25.0000 mg | ORAL_CAPSULE | Freq: Three times a day (TID) | ORAL | Status: AC | PRN

## 2015-09-10 MED ORDER — EPINEPHRINE 0.3 MG/0.3ML IJ SOAJ: 0.3000 mg | Freq: Once | INTRAMUSCULAR | Status: AC | PRN

## 2015-09-10 NOTE — ED Notes (Signed)
Pt states she is feeling better, laying in bed with eyes closed

## 2015-09-10 NOTE — ED Provider Notes (Signed)
CSN: [id number]     Arrival date & time 09/09/15  2325 History   First MD Initiated Contact with Patient 09/09/15 2338     Chief Complaint  Patient presents with  . Allergic Reaction     (Consider location/radiation/quality/duration/timing/severity/associated sxs/prior Treatment) HPI  This a 40 year old otherwise healthy female who presents with possible allergic reaction. Patient reports that she went to dinner tonight. She had a salad, salmon and shrimp, and a posterior. She states on the way home she noted that her tongue was tingly and she "felt weird." She felt like her tongue was swelling. She took 2 Benadryl which improved her symptoms. However, she feels like they are returning. She denies any rash, shortness of breath, vomiting or diarrhea. She does report some nausea. She states that now she feels like she has a lump in her throat. No known history of allergic reaction to similar foods. 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  History reviewed. No pertinent past medical history. Past Surgical History  Procedure Laterality Date  . Abdominal hysterectomy     No family history on file. Social History  Substance Use Topics  . Smoking status: Current Some Day Smoker  . Smokeless tobacco: None  . Alcohol Use: No   OB History    No data available     Review of Systems  HENT: Negative for trouble swallowing and voice change.        Tongue swelling  Respiratory: Negative for shortness of breath.   Cardiovascular: Negative for chest pain.  Gastrointestinal: Negative for nausea, vomiting, abdominal pain and diarrhea.  Skin: Negative for rash.  All other systems reviewed and are negative.     Allergies  Doxycycline  Home Medications   Prior to Admission medications   Medication Sig Start Date End Date Taking? Authorizing Provider  diclofenac (VOLTAREN) 75 MG EC tablet Take 1 tablet (75 mg total) by mouth 2 (two) times daily. 09/16/14   Mikey Kirschner, MD  diphenhydrAMINE (BENADRYL) 25 mg capsule Take 1 capsule (25 mg total) by mouth every 8 (eight) hours as needed for allergies. 09/10/15   Merryl Hacker, MD  EPINEPHrine 0.3 mg/0.3 mL IJ SOAJ injection Inject 0.3 mLs (0.3 mg total) into the muscle once as needed (anaphylaxis). 09/10/15   Merryl Hacker, MD  famotidine (PEPCID) 20 MG tablet Take 1 tablet (20 mg total) by mouth daily. 09/10/15   Merryl Hacker, MD  HYDROcodone-acetaminophen (NORCO/VICODIN) 5-325 MG per tablet Take 1 tablet by mouth at bedtime as needed. 09/16/14   Mikey Kirschner, MD  predniSONE (DELTASONE) 20 MG tablet Take 3 tablets (60 mg total) by mouth daily with breakfast. 09/10/15   Merryl Hacker, MD   BP 104/60 mmHg  Pulse 63  Temp(Src) 98.2 F (36.8 C) (Oral)  Resp 18  Ht 5\' 5"  (1.651 m)  Wt 135 lb (61.236 kg)  BMI 22.47 kg/m2  SpO2 98% Physical Exam  Constitutional: She is oriented to person, place, and time. She appears well-developed and well-nourished. No distress.  ABCs intact  HENT:  Head: Normocephalic and atraumatic.  Mouth/Throat: Oropharynx is clear and moist.  No obvious oral pharyngeal or tongue swelling noted  Neck: Neck supple.  Cardiovascular: Normal rate, regular rhythm and normal heart sounds.   Pulmonary/Chest: Effort normal and breath sounds normal. No stridor. No respiratory distress. She has no wheezes.  Abdominal: Soft. Bowel sounds are normal.  Neurological: She is alert and oriented to  person, place, and time.  Skin: Skin is warm and dry. No pallor.  Psychiatric: She has a normal mood and affect.  Nursing note and vitals reviewed.   ED Course  Procedures (including critical care time) Labs Review Labs Reviewed - No data to display  Imaging Review No results found. I have personally reviewed and evaluated these images and lab results as part of my medical decision-making.   EKG Interpretation None      MDM   Final diagnoses:  Allergic reaction,  initial encounter    Patient presents with what sounds like an allergic reaction. She is nontoxic on exam. ABCs intact. No signs or symptoms at this time of anaphylaxis. Patient was given Benadryl, Pepcid, and redness own. She was observed for approximately 2 hours without progression of symptoms. On recheck, she states that she feels back to her baseline. No indication for epinephrine at this time. Will discharge home with steroids, Benadryl, and Pepcid. She was also given a prescription for an EpiPen. She was given strict return precautions.  After history, exam, and medical workup I feel the patient has been appropriately medically screened and is safe for discharge home. Pertinent diagnoses were discussed with the patient. Patient was given return precautions.     Merryl Hacker, MD 09/10/15 [phone]

## 2015-09-10 NOTE — Discharge Instructions (Signed)

## 2015-09-10 NOTE — ED Notes (Signed)
Pt states understanding of care given and follow up instructions.  Ambulated from ED  

## 2016-09-03 ENCOUNTER — Ambulatory Visit (INDEPENDENT_AMBULATORY_CARE_PROVIDER_SITE_OTHER): Payer: 59 | Admitting: Family Medicine

## 2016-09-03 ENCOUNTER — Encounter: Payer: Self-pay | Admitting: Family Medicine

## 2016-09-03 VITALS — BP 122/74 | Temp 98.3°F | Ht 65.0 in | Wt 138.0 lb

## 2016-09-03 DIAGNOSIS — R21 Rash and other nonspecific skin eruption: Secondary | ICD-10-CM | POA: Diagnosis not present

## 2016-09-03 DIAGNOSIS — Q181 Preauricular sinus and cyst: Secondary | ICD-10-CM | POA: Diagnosis not present

## 2016-09-03 MED ORDER — TRIAMCINOLONE ACETONIDE 0.1 % EX CREA: 1.0000 "application " | TOPICAL_CREAM | Freq: Two times a day (BID) | CUTANEOUS | 1 refills | Status: AC

## 2016-09-03 MED ORDER — METHYLPREDNISOLONE ACETATE 40 MG/ML IJ SUSP
40.0000 mg | Freq: Once | INTRAMUSCULAR | Status: AC
  Administered 2016-09-03: 40 mg via INTRAMUSCULAR

## 2016-09-03 NOTE — Progress Notes (Signed)
   Subjective:    Patient ID: [name], female    DOB: 04-09-1976, 41 y.o.   MRN: [id number]  Rash  This is a new problem. The current episode started yesterday. The rash is characterized by itchiness, redness and swelling. Associated with: working in the yard on 3 days ago. Treatments tried: benadryl. The treatment provided mild relief.    OLDER OVERGROWN AREA got exposed considerably   Only recently developed an allergy To poison ivy  Benadryl prn  Helps a bit   Also has concerns regarding cyst on lat jaw near paroti d gland , painful at times. Definitely has grown the past recent months.      Review of Systems  Skin: Positive for rash.  No fever or chills     Objective:   Physical Exam HEENT left lateral cheek near preauricular/parotid gland region palpable central cyst no adenopathy lungs clear. Heart regular rate and rhythm. Bilateral arm rash. 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 Depo-Medrol injection. Triamcinolone cream twice a day to affected area

## 2016-09-09 ENCOUNTER — Encounter: Payer: Self-pay | Admitting: Family Medicine

## 2016-10-03 ENCOUNTER — Ambulatory Visit (INDEPENDENT_AMBULATORY_CARE_PROVIDER_SITE_OTHER): Payer: 59 | Admitting: Otolaryngology

## 2016-10-03 DIAGNOSIS — D37030 Neoplasm of uncertain behavior of the parotid salivary glands: Secondary | ICD-10-CM | POA: Diagnosis not present

## 2016-10-07 ENCOUNTER — Other Ambulatory Visit (INDEPENDENT_AMBULATORY_CARE_PROVIDER_SITE_OTHER): Payer: Self-pay | Admitting: Otolaryngology

## 2016-10-07 DIAGNOSIS — K118 Other diseases of salivary glands: Secondary | ICD-10-CM

## 2016-10-22 ENCOUNTER — Ambulatory Visit (HOSPITAL_COMMUNITY)
Admission: RE | Admit: 2016-10-22 | Discharge: 2016-10-22 | Disposition: A | Discharge status: Home / Self Care | Payer: 59 | Source: Ambulatory Visit | Attending: Otolaryngology | Admitting: Otolaryngology

## 2016-10-22 DIAGNOSIS — K119 Disease of salivary gland, unspecified: Secondary | ICD-10-CM | POA: Insufficient documentation

## 2016-10-22 DIAGNOSIS — K118 Other diseases of salivary glands: Secondary | ICD-10-CM

## 2016-10-22 MED ORDER — IOPAMIDOL (ISOVUE-300) INJECTION 61%
75.0000 mL | Freq: Once | INTRAVENOUS | Status: AC | PRN
  Administered 2016-10-22: 75 mL via INTRAVENOUS

## 2016-10-28 ENCOUNTER — Ambulatory Visit (INDEPENDENT_AMBULATORY_CARE_PROVIDER_SITE_OTHER): Payer: 59 | Admitting: Otolaryngology

## 2016-10-28 DIAGNOSIS — D37030 Neoplasm of uncertain behavior of the parotid salivary glands: Secondary | ICD-10-CM | POA: Diagnosis not present

## 2016-10-29 ENCOUNTER — Other Ambulatory Visit: Payer: Self-pay | Admitting: Otolaryngology

## 2016-12-02 ENCOUNTER — Encounter (HOSPITAL_BASED_OUTPATIENT_CLINIC_OR_DEPARTMENT_OTHER): Payer: Self-pay | Admitting: *Deleted

## 2016-12-09 ENCOUNTER — Encounter (HOSPITAL_BASED_OUTPATIENT_CLINIC_OR_DEPARTMENT_OTHER)
Admission: RE | Disposition: A | Discharge status: Home / Self Care | Payer: Self-pay | Source: Ambulatory Visit | Attending: Otolaryngology

## 2016-12-09 ENCOUNTER — Ambulatory Visit (HOSPITAL_BASED_OUTPATIENT_CLINIC_OR_DEPARTMENT_OTHER): Payer: 59 | Admitting: Anesthesiology

## 2016-12-09 ENCOUNTER — Encounter (HOSPITAL_BASED_OUTPATIENT_CLINIC_OR_DEPARTMENT_OTHER): Payer: Self-pay | Admitting: *Deleted

## 2016-12-09 ENCOUNTER — Ambulatory Visit (HOSPITAL_BASED_OUTPATIENT_CLINIC_OR_DEPARTMENT_OTHER)
Admission: RE | Admit: 2016-12-09 | Discharge: 2016-12-09 | Disposition: A | Discharge status: Home / Self Care | Payer: 59 | Source: Ambulatory Visit | Attending: Otolaryngology | Admitting: Otolaryngology

## 2016-12-09 DIAGNOSIS — K219 Gastro-esophageal reflux disease without esophagitis: Secondary | ICD-10-CM | POA: Insufficient documentation

## 2016-12-09 DIAGNOSIS — F17200 Nicotine dependence, unspecified, uncomplicated: Secondary | ICD-10-CM | POA: Diagnosis not present

## 2016-12-09 DIAGNOSIS — R51 Headache: Secondary | ICD-10-CM | POA: Insufficient documentation

## 2016-12-09 DIAGNOSIS — R221 Localized swelling, mass and lump, neck: Secondary | ICD-10-CM | POA: Diagnosis present

## 2016-12-09 DIAGNOSIS — D110 Benign neoplasm of parotid gland: Secondary | ICD-10-CM | POA: Insufficient documentation

## 2016-12-09 HISTORY — DX: Gastritis, unspecified, without bleeding: K29.70

## 2016-12-09 HISTORY — DX: Headache, unspecified: R51.9

## 2016-12-09 HISTORY — DX: Headache: R51

## 2016-12-09 HISTORY — DX: Localized swelling, mass and lump, neck: R22.1

## 2016-12-09 HISTORY — PX: PAROTIDECTOMY: SHX2163

## 2016-12-09 SURGERY — EXCISION, PAROTID GLAND
Anesthesia: General | Site: Neck | Laterality: Left

## 2016-12-09 MED ORDER — SCOPOLAMINE 1 MG/3DAYS TD PT72
MEDICATED_PATCH | TRANSDERMAL | Status: AC
  Filled 2016-12-09: qty 1

## 2016-12-09 MED ORDER — DEXAMETHASONE SODIUM PHOSPHATE 10 MG/ML IJ SOLN
INTRAMUSCULAR | Status: AC
Start: 2016-12-09 — End: ?
  Filled 2016-12-09: qty 1

## 2016-12-09 MED ORDER — DEXAMETHASONE SODIUM PHOSPHATE 4 MG/ML IJ SOLN
INTRAMUSCULAR | Status: DC | PRN
  Administered 2016-12-09: 10 mg via INTRAVENOUS

## 2016-12-09 MED ORDER — FENTANYL CITRATE (PF) 100 MCG/2ML IJ SOLN
INTRAMUSCULAR | Status: AC
  Filled 2016-12-09: qty 2

## 2016-12-09 MED ORDER — ONDANSETRON HCL 4 MG/2ML IJ SOLN
INTRAMUSCULAR | Status: DC | PRN
  Administered 2016-12-09: 4 mg via INTRAVENOUS

## 2016-12-09 MED ORDER — LIDOCAINE-EPINEPHRINE 1 %-1:100000 IJ SOLN
INTRAMUSCULAR | Status: AC
  Filled 2016-12-09: qty 1

## 2016-12-09 MED ORDER — PROPOFOL 10 MG/ML IV BOLUS
INTRAVENOUS | Status: DC | PRN
  Administered 2016-12-09: 150 mg via INTRAVENOUS

## 2016-12-09 MED ORDER — PROPOFOL 10 MG/ML IV BOLUS
INTRAVENOUS | Status: AC
  Filled 2016-12-09: qty 20

## 2016-12-09 MED ORDER — OXYCODONE HCL 5 MG PO TABS
ORAL_TABLET | ORAL | Status: AC
  Filled 2016-12-09: qty 1

## 2016-12-09 MED ORDER — OXYCODONE-ACETAMINOPHEN 5-325 MG PO TABS: 1.0000 | ORAL_TABLET | ORAL | 0 refills | Status: AC | PRN

## 2016-12-09 MED ORDER — EPHEDRINE SULFATE 50 MG/ML IJ SOLN
INTRAMUSCULAR | Status: DC | PRN
  Administered 2016-12-09 (×2): 10 mg via INTRAVENOUS

## 2016-12-09 MED ORDER — PROMETHAZINE HCL 25 MG/ML IJ SOLN: 6.2500 mg | INTRAMUSCULAR | Status: DC | PRN

## 2016-12-09 MED ORDER — SUCCINYLCHOLINE CHLORIDE 200 MG/10ML IV SOSY
PREFILLED_SYRINGE | INTRAVENOUS | Status: AC
  Filled 2016-12-09: qty 10

## 2016-12-09 MED ORDER — MIDAZOLAM HCL 2 MG/2ML IJ SOLN
INTRAMUSCULAR | Status: AC
  Filled 2016-12-09: qty 2

## 2016-12-09 MED ORDER — ONDANSETRON HCL 4 MG/2ML IJ SOLN
INTRAMUSCULAR | Status: AC
  Filled 2016-12-09: qty 2

## 2016-12-09 MED ORDER — SUCCINYLCHOLINE CHLORIDE 200 MG/10ML IV SOSY
PREFILLED_SYRINGE | INTRAVENOUS | Status: DC | PRN
  Administered 2016-12-09: 100 mg via INTRAVENOUS

## 2016-12-09 MED ORDER — MIDAZOLAM HCL 2 MG/2ML IJ SOLN
1.0000 mg | INTRAMUSCULAR | Status: DC | PRN
  Administered 2016-12-09: 2 mg via INTRAVENOUS

## 2016-12-09 MED ORDER — LIDOCAINE-EPINEPHRINE 1 %-1:100000 IJ SOLN
INTRAMUSCULAR | Status: DC | PRN
Start: 2016-12-09 — End: 2016-12-09
  Administered 2016-12-09: 2 mL

## 2016-12-09 MED ORDER — SCOPOLAMINE 1 MG/3DAYS TD PT72
1.0000 | MEDICATED_PATCH | Freq: Once | TRANSDERMAL | Status: AC | PRN
  Administered 2016-12-09: 1 via TRANSDERMAL

## 2016-12-09 MED ORDER — OXYCODONE HCL 5 MG PO TABS
5.0000 mg | ORAL_TABLET | Freq: Once | ORAL | Status: AC
  Administered 2016-12-09: 5 mg via ORAL

## 2016-12-09 MED ORDER — CEFAZOLIN SODIUM-DEXTROSE 2-3 GM-% IV SOLR
INTRAVENOUS | Status: DC | PRN
  Administered 2016-12-09: 2 g via INTRAVENOUS

## 2016-12-09 MED ORDER — FENTANYL CITRATE (PF) 100 MCG/2ML IJ SOLN: 25.0000 ug | INTRAMUSCULAR | Status: DC | PRN

## 2016-12-09 MED ORDER — LIDOCAINE 2% (20 MG/ML) 5 ML SYRINGE
INTRAMUSCULAR | Status: DC | PRN
  Administered 2016-12-09: 100 mg via INTRAVENOUS

## 2016-12-09 MED ORDER — LACTATED RINGERS IV SOLN
INTRAVENOUS | Status: DC
  Administered 2016-12-09 (×2): via INTRAVENOUS

## 2016-12-09 MED ORDER — AMOXICILLIN 875 MG PO TABS: 875.0000 mg | ORAL_TABLET | Freq: Two times a day (BID) | ORAL | 0 refills | Status: DC

## 2016-12-09 MED ORDER — FENTANYL CITRATE (PF) 100 MCG/2ML IJ SOLN
50.0000 ug | INTRAMUSCULAR | Status: AC | PRN
  Administered 2016-12-09 (×2): 25 ug via INTRAVENOUS
  Administered 2016-12-09: 100 ug via INTRAVENOUS

## 2016-12-09 MED ORDER — PHENYLEPHRINE HCL 10 MG/ML IJ SOLN
INTRAMUSCULAR | Status: DC | PRN
  Administered 2016-12-09: 40 ug via INTRAVENOUS
  Administered 2016-12-09: 80 ug via INTRAVENOUS

## 2016-12-09 SURGICAL SUPPLY — 65 items
ADH SKN CLS APL DERMABOND .7 (GAUZE/BANDAGES/DRESSINGS) ×1
APL SRG 3 HI ABS STRL LF PLS (MISCELLANEOUS) ×1
APPLICATOR DR MATTHEWS STRL (MISCELLANEOUS) ×2 IMPLANT
ATTRACTOMAT 16X20 MAGNETIC DRP (DRAPES) IMPLANT
BLADE SURG 15 STRL LF DISP TIS (BLADE) ×1 IMPLANT
BLADE SURG 15 STRL SS (BLADE) ×2
CANISTER SUCT 1200ML W/VALVE (MISCELLANEOUS) ×2 IMPLANT
CORD BIPOLAR FORCEPS 12FT (ELECTRODE) ×2 IMPLANT
COTTONBALL LRG STERILE PKG (GAUZE/BANDAGES/DRESSINGS) ×2 IMPLANT
COVER BACK TABLE 60X90IN (DRAPES) ×2 IMPLANT
COVER MAYO STAND STRL (DRAPES) ×2 IMPLANT
DECANTER SPIKE VIAL GLASS SM (MISCELLANEOUS) IMPLANT
DERMABOND ADVANCED (GAUZE/BANDAGES/DRESSINGS) ×1
DERMABOND ADVANCED .7 DNX12 (GAUZE/BANDAGES/DRESSINGS) ×1 IMPLANT
DRAIN CHANNEL 10F 3/8 F FF (DRAIN) IMPLANT
DRAPE SURG 17X23 STRL (DRAPES) ×2 IMPLANT
DRAPE U-SHAPE 76X120 STRL (DRAPES) ×2 IMPLANT
DRSG GLASSCOCK MASTOID ADT (GAUZE/BANDAGES/DRESSINGS) ×2 IMPLANT
ELECT COATED BLADE 2.86 ST (ELECTRODE) ×2 IMPLANT
ELECT PAIRED SUBDERMAL (MISCELLANEOUS) ×2
ELECT REM PT RETURN 9FT ADLT (ELECTROSURGICAL) ×2
ELECTRODE PAIRED SUBDERMAL (MISCELLANEOUS) ×1 IMPLANT
ELECTRODE REM PT RTRN 9FT ADLT (ELECTROSURGICAL) ×1 IMPLANT
EVACUATOR SILICONE 100CC (DRAIN) IMPLANT
FORCEPS BIPOLAR SPETZLER 8 1.0 (NEUROSURGERY SUPPLIES) ×2 IMPLANT
GAUZE SPONGE 4X4 16PLY XRAY LF (GAUZE/BANDAGES/DRESSINGS) ×2 IMPLANT
GLOVE BIO SURGEON STRL SZ 6.5 (GLOVE) IMPLANT
GLOVE BIO SURGEON STRL SZ7 (GLOVE) ×2 IMPLANT
GLOVE BIO SURGEON STRL SZ7.5 (GLOVE) ×2 IMPLANT
GLOVE BIOGEL PI IND STRL 7.0 (GLOVE) ×1 IMPLANT
GLOVE BIOGEL PI INDICATOR 7.0 (GLOVE) ×1
GLOVE EXAM NITRILE MD LF STRL (GLOVE) ×2 IMPLANT
GLOVE SURG SS PI 6.5 STRL IVOR (GLOVE) ×2 IMPLANT
GLOVE SURG SS PI 7.0 STRL IVOR (GLOVE) ×2 IMPLANT
GOWN STRL REUS W/ TWL LRG LVL3 (GOWN DISPOSABLE) ×2 IMPLANT
GOWN STRL REUS W/ TWL XL LVL3 (GOWN DISPOSABLE) ×1 IMPLANT
GOWN STRL REUS W/TWL LRG LVL3 (GOWN DISPOSABLE) ×2
GOWN STRL REUS W/TWL XL LVL3 (GOWN DISPOSABLE) ×1
LOCATOR NERVE 3 VOLT (DISPOSABLE) IMPLANT
NEEDLE HYPO 25X1 1.5 SAFETY (NEEDLE) ×2 IMPLANT
NS IRRIG 1000ML POUR BTL (IV SOLUTION) ×2 IMPLANT
PACK BASIN DAY SURGERY FS (CUSTOM PROCEDURE TRAY) ×2 IMPLANT
PAD ALCOHOL SWAB (MISCELLANEOUS) ×4 IMPLANT
PENCIL BUTTON HOLSTER BLD 10FT (ELECTRODE) ×2 IMPLANT
PIN SAFETY STERILE (MISCELLANEOUS) IMPLANT
PROBE NERVBE PRASS .33 (MISCELLANEOUS) ×2 IMPLANT
SHEARS HARMONIC 9CM CVD (BLADE) ×2 IMPLANT
SLEEVE SCD COMPRESS KNEE MED (MISCELLANEOUS) ×2 IMPLANT
SPONGE INTESTINAL PEANUT (DISPOSABLE) ×2 IMPLANT
STAPLER VISISTAT 35W (STAPLE) IMPLANT
SUT ETHILON 3 0 PS 1 (SUTURE) ×2 IMPLANT
SUT PROLENE 5 0 P 3 (SUTURE) ×2 IMPLANT
SUT SILK 2 0 FS (SUTURE) ×6 IMPLANT
SUT SILK 2 0 TIES 17X18 (SUTURE)
SUT SILK 2-0 18XBRD TIE BLK (SUTURE) IMPLANT
SUT SILK 3 0 TIES 17X18 (SUTURE) ×2
SUT SILK 3-0 18XBRD TIE BLK (SUTURE) ×1 IMPLANT
SUT SILK 4 0 TIES 17X18 (SUTURE) IMPLANT
SUT VIC AB 3-0 FS2 27 (SUTURE) IMPLANT
SUT VICRYL 4-0 PS2 18IN ABS (SUTURE) ×2 IMPLANT
SYR BULB 3OZ (MISCELLANEOUS) ×2 IMPLANT
SYR CONTROL 10ML LL (SYRINGE) ×2 IMPLANT
TOWEL OR 17X24 6PK STRL BLUE (TOWEL DISPOSABLE) ×2 IMPLANT
TRAY DSU PREP LF (CUSTOM PROCEDURE TRAY) ×2 IMPLANT
TUBE CONNECTING 20X1/4 (TUBING) ×2 IMPLANT

## 2016-12-09 NOTE — Anesthesia Procedure Notes (Signed)
Procedure Name: Intubation Date/Time: 12/09/2016 10:14 AM Performed by: Lyndee Leo Pre-anesthesia Checklist: Patient identified, Emergency Drugs available, Suction available and Patient being monitored Patient Re-evaluated:Patient Re-evaluated prior to induction Oxygen Delivery Method: Circle system utilized Preoxygenation: Pre-oxygenation with 100% oxygen Induction Type: IV induction Ventilation: Mask ventilation without difficulty Laryngoscope Size: Mac and 3 Grade View: Grade I Tube type: Oral Rae Tube size: 7.0 mm Number of attempts: 1 Airway Equipment and Method: Stylet and Oral airway Placement Confirmation: ETT inserted through vocal cords under direct vision,  positive ETCO2 and breath sounds checked- equal and bilateral Secured at: 21 cm Tube secured with: Tape Dental Injury: Teeth and Oropharynx as per pre-operative assessment

## 2016-12-09 NOTE — Anesthesia Preprocedure Evaluation (Addendum)
Anesthesia Evaluation  Patient identified by MRN, date of birth, ID band Patient awake    Reviewed: Allergy & Precautions, NPO status , Patient's Chart, lab work & pertinent test results  Airway Mallampati: III  TM Distance: >3 FB Neck ROM: Full    Dental  (+) Teeth Intact, Dental Advisory Given, Chipped,    Pulmonary Current Smoker,    Pulmonary exam normal breath sounds clear to auscultation       Cardiovascular negative cardio ROS Normal cardiovascular exam Rhythm:Regular Rate:Normal     Neuro/Psych  Headaches, negative psych ROS   GI/Hepatic Neg liver ROS, GERD  Medicated,  Endo/Other  negative endocrine ROS  Renal/GU negative Renal ROS     Musculoskeletal negative musculoskeletal ROS (+)   Abdominal   Peds  Hematology negative hematology ROS (+)   Anesthesia Other Findings Day of surgery medications reviewed with the patient.  Left parotid mass  Reproductive/Obstetrics negative OB ROS                            Anesthesia Physical Anesthesia Plan  ASA: II  Anesthesia Plan: General   Post-op Pain Management:    Induction: Intravenous  PONV Risk Score and Plan: 2 and Ondansetron, Dexamethasone and Midazolam  Airway Management Planned: Oral ETT  Additional Equipment:   Intra-op Plan:   Post-operative Plan: Extubation in OR  Informed Consent: I have reviewed the patients History and Physical, chart, labs and discussed the procedure including the risks, benefits and alternatives for the proposed anesthesia with the patient or authorized representative who has indicated his/her understanding and acceptance.   Dental advisory given  Plan Discussed with: CRNA  Anesthesia Plan Comments: (Risks/benefits of general anesthesia discussed with patient including risk of damage to teeth, lips, gum, and tongue, nausea/vomiting, allergic reactions to medications, and the possibility  of heart attack, stroke and death.  All patient questions answered.  Patient wishes to proceed.)        Anesthesia Quick Evaluation

## 2016-12-09 NOTE — H&P (Signed)
Cc: Left facial mass  HPI: The patient is a 41 year old female who returns today for her follow-up evaluation.  The patient was last seen 3 weeks ago.  At that time, she was noted to have a 1 cm left facial mass.  She subsequently underwent a facial CT scan.  The CT scan showed an irregular lobulation within the left parotid gland.  The clinical findings and the CT images are consistent with a parotid mass.  It should be noted the size of the mass has tripled over the past few years.  The patient is interested in having the mass removed. No other ENT, GI, or respiratory issue noted since the last visit.   Exam: General: Communicates without difficulty, well nourished, no acute distress. Head: Normocephalic, no evidence injury, no tenderness, facial buttresses intact without stepoff. Eyes: PERRL, EOMI. No scleral icterus, conjunctivae clear. Neuro: CN II exam reveals vision grossly intact.  No nystagmus at any point of gaze. Ears: Auricles well formed without lesions.  Ear canals are intact without mass or lesion.  No erythema or edema is appreciated.  The TMs are intact without fluid. Nose: External evaluation reveals normal support and skin without lesions.  Dorsum is intact.  Anterior rhinoscopy reveals healthy pink mucosa over anterior aspect of inferior turbinates and intact septum.  No purulence noted. Oral:  Oral cavity and oropharynx are intact, symmetric, without erythema or edema.  Mucosa is moist without lesions. Neck: Full range of motion without pain.  There is no significant lymphadenopathy. Thyroid bed within normal limits to palpation.  Submandibular glands equal bilaterally without mass.  A 1 cm left facial/parotid mass is noted. Trachea is midline. Neuro:  CN 2-12 grossly intact. Gait normal. Vestibular: No nystagmus at any point of gaze. The cerebellar examination is unremarkable.   Assessment 1.  The patient's facial CT scan shows a 1.2 cm left parotid mass.  2.  Currently the patient  has no facial nerve weakness.  However, she complains of increasing discomfort around the left parotid gland.   Plan  1.  The physical exam findings and the CT images are extensively discussed with the patient.  2.  The treatment options are also extensively reviewed. The options include conservative observation, fine needle aspiration biopsy of the parotid mass, and surgical excision of the parotid mass.  The risks, benefits, alternatives and details of the superficial parotidectomy surgery are discussed.  3.  The patient would like to proceed with the procedure.

## 2016-12-09 NOTE — Discharge Instructions (Addendum)
Parotidectomy, Care After Refer to this sheet in the next few weeks. These instructions provide you with information about caring for yourself after your procedure. Your health care provider may also give you more specific instructions. Your treatment has been planned according to current medical practices, but problems sometimes occur. Call your health care provider if you have any problems or questions after your procedure. What can I expect after the procedure? After your procedure, it is common to have:  Pain and mild swelling at the site of your incision.  Numbness along the incision.  Numbness in part or all of your ear.  Mild jaw discomfort on the surgical side when you are eating or chewing.  Follow these instructions at home: Medicines  Take over-the-counter and prescription medicines only as told by your health care provider.  If you were prescribed an antibiotic medicine, take it as told by your health care provider. Do not stop taking the antibiotic even if you start to feel better. Incision care   Check your incision area every day for signs of infection. Check for: ? More redness, swelling, or pain. ? More fluid or blood. ? Warmth ? Pus or a bad smell. Eating and drinking  Follow instructions from your health care provider about eating or drinking restrictions.  If your mouth or jaw is sore, try eating soft foods until you feel better.  Drink enough fluid to keep your urine clear or pale yellow. General instructions  Return to your normal activities as told by your health care provider. Ask your health care provider what activities are safe for you.  Keep your head raised (elevated) when you lie down. Try using two pillows to do this. Do this until your incision heals.  Keep all follow-up visits as told by your health care provider. This is important. Contact a health care provider if:  You have pain that does not get better with medicine.  You have more  redness, swelling, or pain around your incision.  You have more fluid or blood coming from your incision.  Your incision feels warm to the touch.  You have pus or a bad smell coming from your incision.  You vomit or feel nauseous.  You have a fever.  Your face sweats or turns red when you eat. Get help right away if:  You have pain, swelling, or redness that suddenly gets worse at the incision site.  You have increasing numbness or weakness in your face.  You have severe pain. This information is not intended to replace advice given to you by your health care provider. Make sure you discuss any questions you have with your health care provider. Document Released: 06/08/2010 Document Revised: 01/04/2016 Document Reviewed: 04/11/2015 Elsevier Interactive Patient Education  2018 [employer] [facility] Instructions  Activity: Get plenty of rest for the remainder of the day. A responsible individual must stay with you for 24 hours following the procedure.  For the next 24 hours, DO NOT: -Drive a car -Paediatric nurse -Drink alcoholic beverages -Take any medication unless instructed by your physician -Make any legal decisions or sign important papers.  Meals: Start with liquid foods such as gelatin or soup. Progress to regular foods as tolerated. Avoid greasy, spicy, heavy foods. If nausea and/or vomiting occur, drink only clear liquids until the nausea and/or vomiting subsides. Call your physician if vomiting continues.  Special Instructions/Symptoms: Your throat may feel dry or sore from the anesthesia or the breathing tube  placed in your throat during surgery. If this causes discomfort, gargle with warm salt water. The discomfort should disappear within 24 hours.  If you had a scopolamine patch placed behind your ear for the management of post- operative nausea and/or vomiting:  1. The medication in the patch is effective for 72 hours, after which it  should be removed.  Wrap patch in a tissue and discard in the trash. Wash hands thoroughly with soap and water. 2. You may remove the patch earlier than 72 hours if you experience unpleasant side effects which may include dry mouth, dizziness or visual disturbances. 3. Avoid touching the patch. Wash your hands with soap and water after contact with the patch.

## 2016-12-09 NOTE — Op Note (Signed)
DATE OF PROCEDURE:  12/09/2016                              OPERATIVE REPORT  SURGEON:  Leta Baptist, MD  PREOPERATIVE DIAGNOSES: 1. Left parotid mass  POSTOPERATIVE DIAGNOSES: 1. Left parotid mass  PROCEDURE PERFORMED:  Left lateral parotidectomy with facial nerve dissection.  ANESTHESIA:  General endotracheal tube anesthesia.  COMPLICATIONS:  None.  ESTIMATED BLOOD LOSS:  Minimal.  INDICATION FOR PROCEDURE:  [name] is a 41 y.o. female with a history of an enlarging left facial mass. She subsequently underwent a facial CT scan. The CT showed an irregular lobulation within the left parotid gland. The clinical findings and the CT images are consistent with a parotid mass. It should be noted that the size of the mass has tripled over the past few years. The patient would like to have the mass surgically removed. Based on the above findings, the decision was made for the patient to undergo the above-stated procedure.   The risks, benefits, alternatives, and details of the procedure were discussed with the patient.  Questions were invited and answered.  Informed consent was obtained.  DESCRIPTION:  The patient was taken to the operating room and placed supine on the operating table.  General endotracheal tube anesthesia was administered by the anesthesiologist.  The patient was positioned and prepped and draped in a standard fashion for left parotidectomy surgery.  Facial nerve monitoring electrodes were placed. The facial nerve monitoring system was functional throughout the case. 1% lidocaine with 1-100,000 epinephrine was infiltrated at the planned site of incision. A standard preauricular left parotid incision was made. The incision was carried down to the subcutaneous level. The SMAS skin flap was elevated in a standard fashion. A 1 cm parotid mass was noted. Careful dissection was then carried out along the preauricular cartilaginous plane. The main trunk of the facial nerve and the  inferior and superior branches were identified and preserved. The left parotid mass was then dissected free from the branches of the facial nerve. All branches were noted to be intact and functional at the end of the case. The specimen was sent to the pathology department as a fresh specimen for histologic identification. The surgical site was copiously irrigated. The incision was closed in layers with 4-0 Vicryl, 5-0 Prolene, and Dermabond. A compressive dressing was applied.   The care of the patient was turned over to the anesthesiologist.  The patient was awakened from anesthesia without difficulty.  The patient was extubated and transferred to the recovery room in good condition.  OPERATIVE FINDINGS:  A 1 cm left parotid mass.  SPECIMEN:  Left parotid mass.  FOLLOWUP CARE:  The patient will be discharged home once awake and alert.    Berniece Abid Cassie Freer 12/09/2016 12:25 PM

## 2016-12-09 NOTE — Transfer of Care (Signed)
Immediate Anesthesia Transfer of Care Note  Patient: [name]  Procedure(s) Performed: Procedure(s) with comments: LEFT LATERAL PAROTIDECTOMY (Left) - Needs RNFA  Patient Location: PACU  Anesthesia Type:General  Level of Consciousness: awake, sedated and patient cooperative  Airway & Oxygen Therapy: Patient Spontanous Breathing and Patient connected to face mask oxygen  Post-op Assessment: Report given to RN and Post -op Vital signs reviewed and stable  Post vital signs: Reviewed and stable  Last Vitals:  Vitals:   12/09/16 0846  Pulse: 68  Resp: 16  Temp: 36.7 C    Last Pain:  Vitals:   12/09/16 0846  TempSrc: Oral  PainSc: 0-No pain      Patients Stated Pain Goal: 3 (98/42/10 3128)  Complications: No apparent anesthesia complications

## 2016-12-09 NOTE — Anesthesia Postprocedure Evaluation (Signed)
Anesthesia Post Note  Patient: [name]  Procedure(s) Performed: Procedure(s) (LRB): LEFT LATERAL PAROTIDECTOMY (Left)     Patient location during evaluation: PACU Anesthesia Type: General Level of consciousness: awake and alert Pain management: pain level controlled Vital Signs Assessment: post-procedure vital signs reviewed and stable Respiratory status: spontaneous breathing, nonlabored ventilation and respiratory function stable Cardiovascular status: blood pressure returned to baseline and stable Postop Assessment: no signs of nausea or vomiting Anesthetic complications: no    Last Vitals:  Vitals:   12/09/16 1300 12/09/16 1330  BP: 110/76 119/75  Pulse: 99 83  Resp: 16 18  Temp:  36.7 C    Last Pain:  Vitals:   12/09/16 1330  TempSrc:   PainSc: 3                  Catalina Gravel

## 2016-12-10 ENCOUNTER — Encounter (HOSPITAL_BASED_OUTPATIENT_CLINIC_OR_DEPARTMENT_OTHER): Payer: Self-pay | Admitting: Otolaryngology

## 2016-12-16 ENCOUNTER — Ambulatory Visit (INDEPENDENT_AMBULATORY_CARE_PROVIDER_SITE_OTHER): Payer: 59 | Admitting: Otolaryngology

## 2017-06-16 ENCOUNTER — Ambulatory Visit (INDEPENDENT_AMBULATORY_CARE_PROVIDER_SITE_OTHER): Payer: 59 | Admitting: Otolaryngology

## 2017-06-16 DIAGNOSIS — D110 Benign neoplasm of parotid gland: Secondary | ICD-10-CM

## 2018-06-15 ENCOUNTER — Ambulatory Visit (INDEPENDENT_AMBULATORY_CARE_PROVIDER_SITE_OTHER): Payer: BLUE CROSS/BLUE SHIELD | Admitting: Otolaryngology

## 2018-06-15 DIAGNOSIS — Z86018 Personal history of other benign neoplasm: Secondary | ICD-10-CM | POA: Diagnosis not present

## 2018-08-30 IMAGING — CT CT NECK W/ CM
3 of 4 series · 12 of 33 positions shown, 14 images · IV contrast (iopamidol)
Comparison: 05/26/2006

CLINICAL DATA: Left parotid mass, present for years. Previously
described as cyst. Growth and pain.

EXAM:
CT NECK WITH CONTRAST
TECHNIQUE: Multidetector CT imaging of the neck was performed using the
standard protocol following the bolus administration of intravenous
contrast.
CONTRAST:  75mL KR4GPP-8EE IOPAMIDOL (KR4GPP-8EE) INJECTION 61%

[Series 2: axial neck · axial · 0.42mm/px · z∈[+1446,+1582]mm · 4 of 104 slices shown, 5 images]
[im 18/104  soft-tissue]
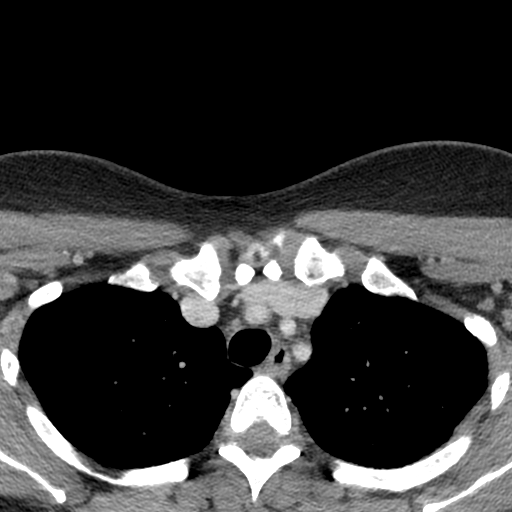
[im 18/104  bone]
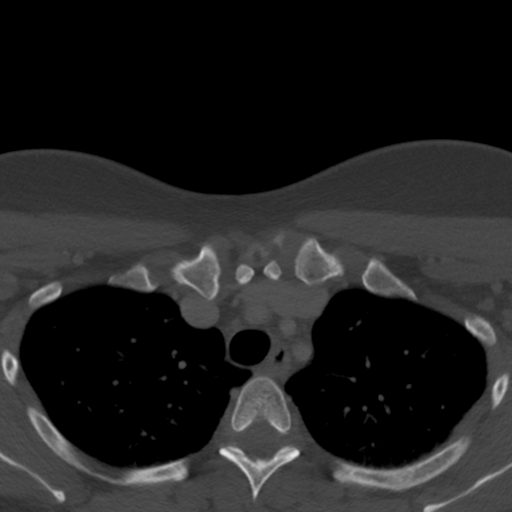
[im 35/104  bone]
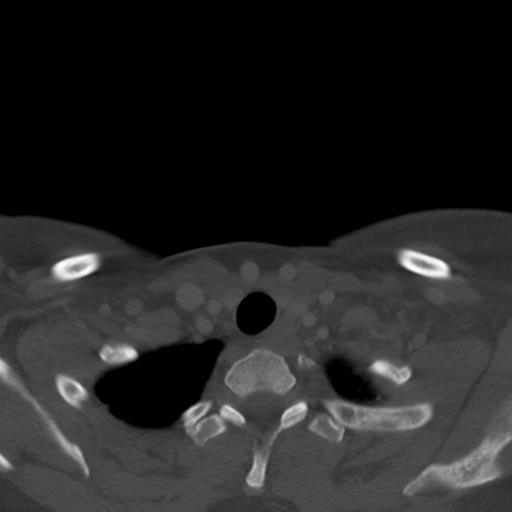
[im 69/104  bone]
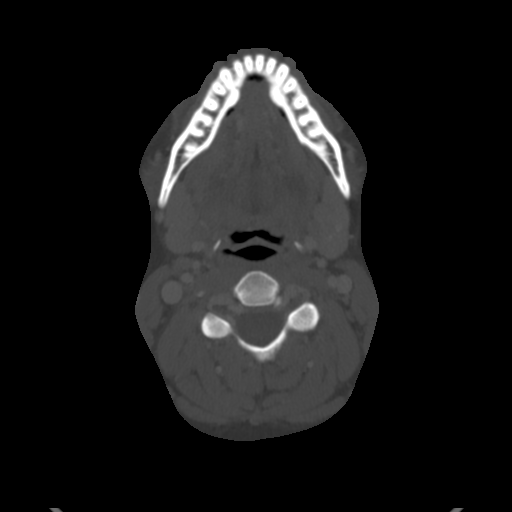
[im 86/104  bone]
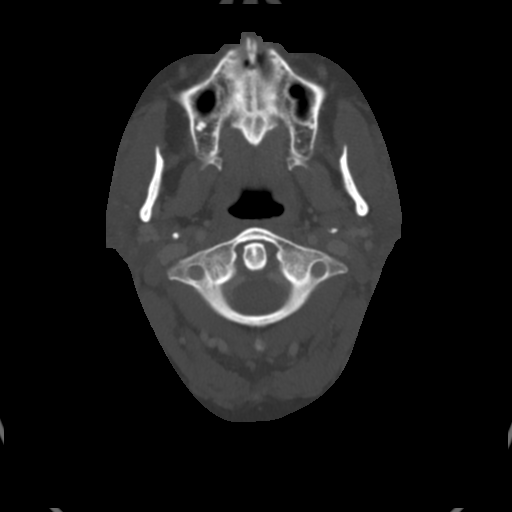

[Series 6: coronal neck · coronal · 0.32mm/px · 3 of 93 slices shown]
[im 19/93  bone]
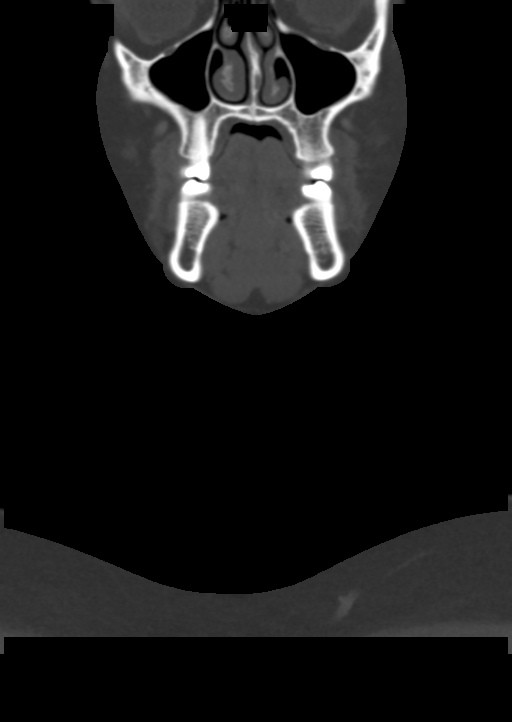
[im 37/93  bone]
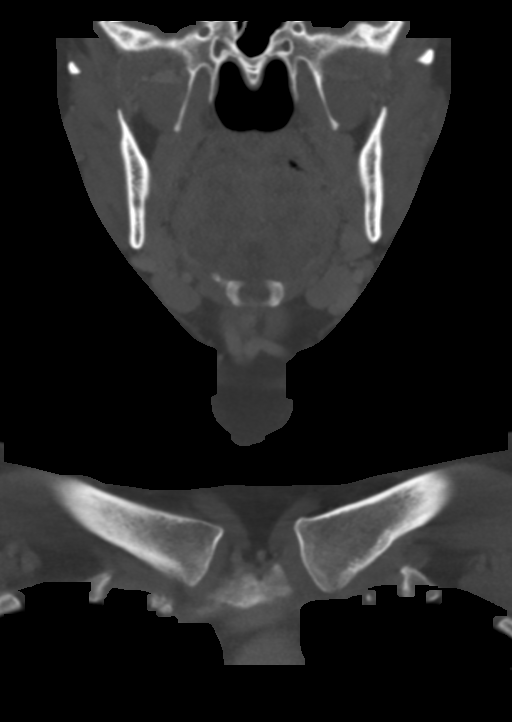
[im 56/93  bone]
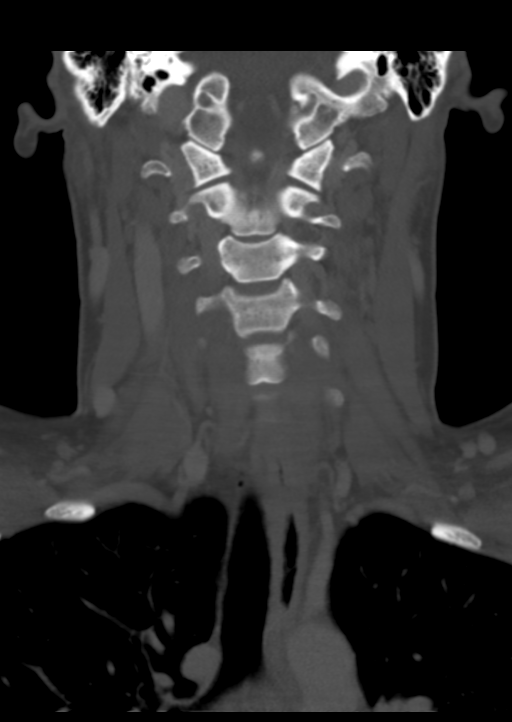

[Series 7: sagittal neck · sagittal · 0.38mm/px · 5 of 101 slices shown, 6 images]
[im 34/101  bone]
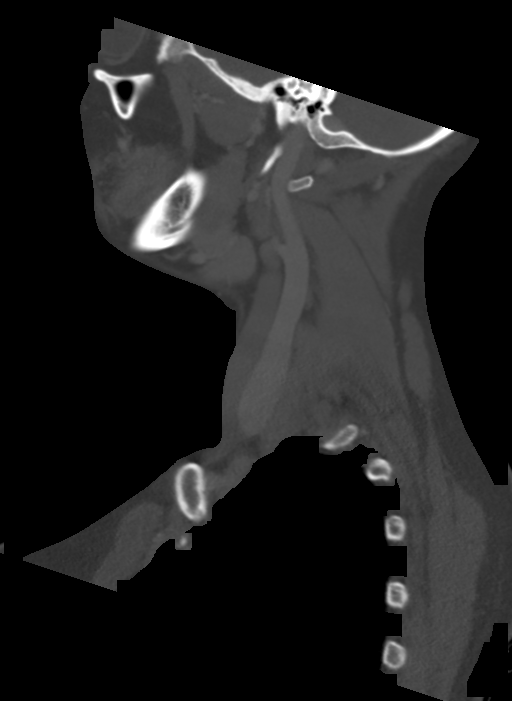
[im 42/101  bone]
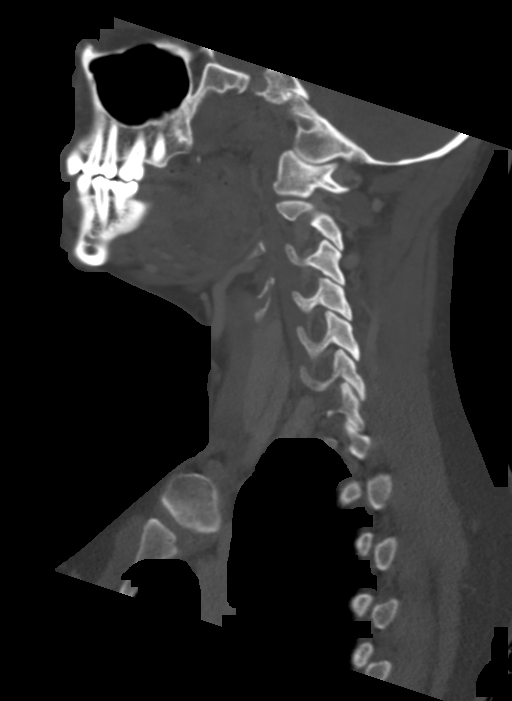
[im 51/101  soft-tissue]
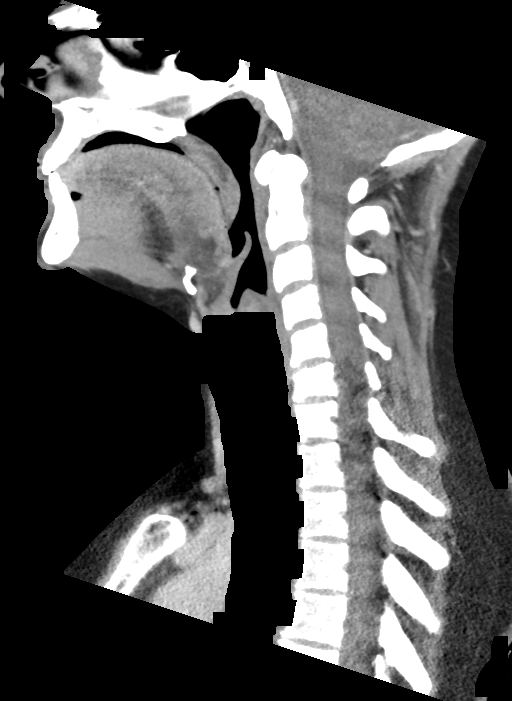
[im 51/101  bone]
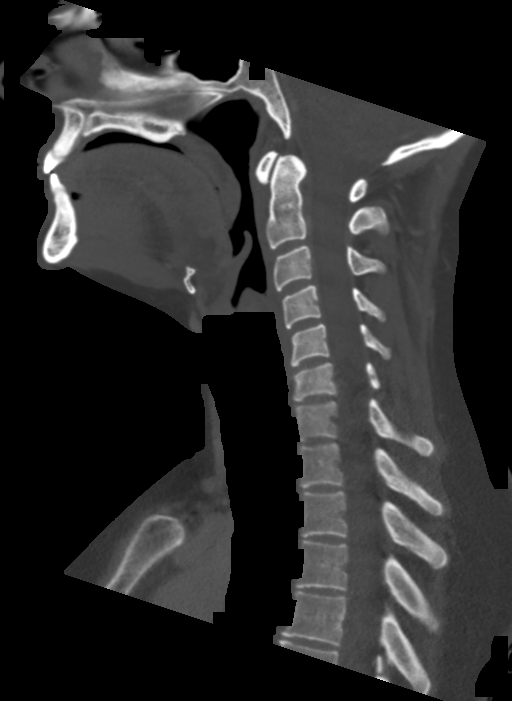
[im 59/101  bone]
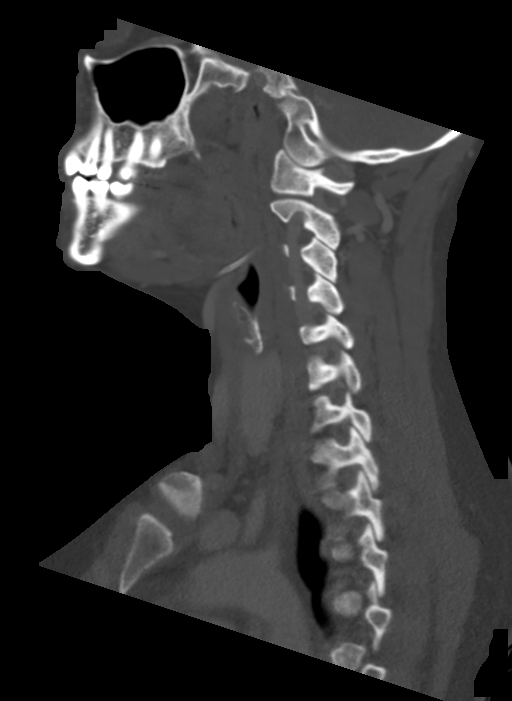
[im 67/101  bone]
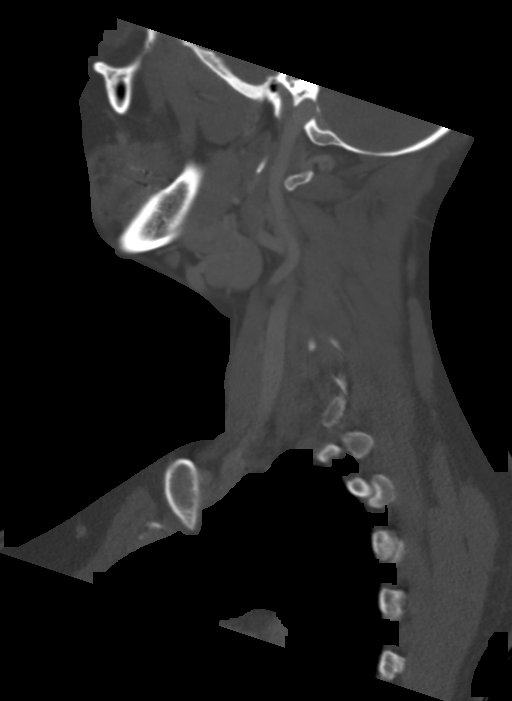

[12 of 33 positions shown; findings below may reference images not displayed]

FINDINGS: Pharynx and larynx: Normal

Salivary glands: Both submandibular glands are normal. The right
parotid gland is normal. The left parotid gland would be interpreted
as normal in a blinded fashion, but given the history and given
localizing skin markers placed by the technologist above and below
the region of concern, there is a slight lobular contour of the
superficial lobe of left parotid at the base of the anterior
process. This measures approximately 11 x 12 mm. By CT, this does
not show density difference from the remainder of the gland and
would be most consistent with a lobulation of normal parotid tissue.
Isodense cyst or isodense tumor not excluded given this history.
Ultrasound or MRI could characterize this more precisely.

Thyroid: Normal

Lymph nodes: No enlarged or low-density nodes on either side of the
neck.

Vascular: Normal

Limited intracranial: Normal

Visualized orbits: Normal

Mastoids and visualized paranasal sinuses: Clear

Skeleton: Normal

Upper chest: Normal

Other: None
IMPRESSION: In the area of concern, there is a slightly lobular contour of the
superficial left parotid. This shows density and enhancement
characteristics the same as the remainder of the parotid tissue.
This could represent a normal variant slight parotid lobulation.
However, given the history, isodense cyst or tumor not completely
excluded. This could either be followed or evaluated further with
ultrasound or MRI.

## 2019-05-04 ENCOUNTER — Ambulatory Visit: Payer: No Typology Code available for payment source | Attending: Internal Medicine

## 2019-05-04 ENCOUNTER — Other Ambulatory Visit: Payer: Self-pay

## 2019-05-04 DIAGNOSIS — Z20822 Contact with and (suspected) exposure to covid-19: Secondary | ICD-10-CM

## 2019-05-05 LAB — NOVEL CORONAVIRUS, NAA: SARS-CoV-2, NAA: NOT DETECTED

## 2019-06-21 ENCOUNTER — Encounter: Payer: Self-pay | Admitting: Family Medicine

## 2020-08-09 ENCOUNTER — Ambulatory Visit
Admission: EM | Admit: 2020-08-09 | Discharge: 2020-08-09 | Disposition: A | Discharge status: Home / Self Care | Payer: PRIVATE HEALTH INSURANCE | Attending: Emergency Medicine | Admitting: Emergency Medicine

## 2020-08-09 ENCOUNTER — Other Ambulatory Visit: Payer: Self-pay

## 2020-08-09 DIAGNOSIS — S0501XA Injury of conjunctiva and corneal abrasion without foreign body, right eye, initial encounter: Secondary | ICD-10-CM

## 2020-08-09 DIAGNOSIS — H18891 Other specified disorders of cornea, right eye: Secondary | ICD-10-CM | POA: Diagnosis not present

## 2020-08-09 MED ORDER — POLYMYXIN B-TRIMETHOPRIM 10000-0.1 UNIT/ML-% OP SOLN: 1.0000 [drp] | Freq: Four times a day (QID) | OPHTHALMIC | 0 refills | Status: AC

## 2020-08-09 NOTE — ED Provider Notes (Signed)
[place]   [id number] 08/09/20 Arrival Time: 5643  CC: Red eye  SUBJECTIVE:  [name] is a 45 y.o. female who presents with complaint of RT eye redness, discomfort, and clear drainage/ tearing that began this morning.  Denies a precipitating event, trauma, but speculates she may have slept on her hand against her eye.  She is concerned she scratched her eye.  Has tried rinsing eye out without relief.  Symptoms are made worse with light.  Denies similar symptoms in the past.  Complains of associated FB sensation.  Denies fever, chills, nausea, vomiting,  painful eye movements, itching, vision changes, double vision, periorbital erythema.     Denies contact lens use.    ROS: As per HPI.  All other pertinent ROS negative.     Past Medical History:  Diagnosis Date  . Gastritis   . Headache   . Neck mass    left   Past Surgical History:  Procedure Laterality Date  . ABDOMINAL HYSTERECTOMY    . BLADDER SUSPENSION    . ENDOMETRIAL ABLATION    . PAROTIDECTOMY Left 12/09/2016   Procedure: LEFT LATERAL PAROTIDECTOMY;  Surgeon: Leta Baptist, MD;  Location: [facility];  Service: ENT;  Laterality: Left;  Needs RNFA  . TUBAL LIGATION     Allergies  Allergen Reactions  . Peanut-Containing Drug Products Swelling    Lips blister  . Doxycycline Hives   No current facility-administered medications on file prior to encounter.   Current Outpatient Medications on File Prior to Encounter  Medication Sig Dispense Refill  . diphenhydrAMINE (BENADRYL) 25 mg capsule Take 1 capsule (25 mg total) by mouth every 8 (eight) hours as needed for allergies. 30 capsule 0  . EPINEPHrine 0.3 mg/0.3 mL IJ SOAJ injection Inject 0.3 mLs (0.3 mg total) into the muscle once as needed (anaphylaxis). 1 Device 0  . famotidine (PEPCID) 20 MG tablet Take 1 tablet (20 mg total) by mouth daily. 30 tablet 0  . oxyCODONE-acetaminophen (ROXICET) 5-325 MG tablet Take 1 tablet by mouth every 4  (four) hours as needed for severe pain. 25 tablet 0  . triamcinolone cream (KENALOG) 0.1 % Apply 1 application topically 2 (two) times daily. 30 g 1   Social History   Socioeconomic History  . Marital status: Married    Spouse name: Not on file  . Number of children: Not on file  . Years of education: Not on file  . Highest education level: Not on file  Occupational History  . Not on file  Tobacco Use  . Smoking status: Current Every Day Smoker    Packs/day: 0.50  . Smokeless tobacco: Never Used  Substance and Sexual Activity  . Alcohol use: Yes    Comment: social  . Drug use: No  . Sexual activity: Not on file  Other Topics Concern  . Not on file  Social History Narrative  . Not on file   Social Determinants of Health   Financial Resource Strain: Not on file  Food Insecurity: Not on file  Transportation Needs: Not on file  Physical Activity: Not on file  Stress: Not on file  Social Connections: Not on file  Intimate Partner Violence: Not on file   Family History  Family history unknown: Yes    OBJECTIVE:   Vitals:   08/09/20 0911  BP: (!) 146/78  Pulse: 90  Resp: 16  Temp: 98.4 F (36.9 C)  TempSrc: Oral  SpO2: 97%    General appearance:  alert; no distress HENT: NCAT; nares patent; oropharynx clear Eyes: RT trace conjunctival erythema. PERRL; EOMI without discomfort;  clear drainage/ tearing; fluorescein uptake in 3 o'clock position  Neck: supple Lungs: normal respiratory effort Skin: warm and dry Psychological: alert and cooperative; normal mood and affect  ASSESSMENT & PLAN:  1. Corneal irritation of right eye   2. Abrasion of right cornea, initial encounter     Meds ordered this encounter  Medications  . trimethoprim-polymyxin b (POLYTRIM) ophthalmic solution    Sig: Place 1 drop into the right eye 4 (four) times daily for 10 days.    Dispense:  10 mL    Refill:  0    Order Specific Question:   Supervising Provider    Answer:   Raylene Everts [[id number]]   Use polytrim as prescribed and to completion Use OTC systane or genteal gel eye drops at night as needed for symptomatic relief Use OTC ibuprofen or tylenol as needed for pain relief Return here or follow up with ophthamolgy if symptoms persists or worsen such as fever, chills, redness, swelling, eye pain, painful eye movements, vision changes, etc...  Reviewed expectations re: course of current medical issues. Questions answered. Outlined signs and symptoms indicating need for more acute intervention. Patient verbalized understanding. After Visit Summary given.   Lestine Box, PA-C 08/09/20 [phone]

## 2020-08-09 NOTE — ED Triage Notes (Signed)
Pt has right eye irritation , pt thinks she may have scratched it during sleep, no vision changes

## 2020-08-09 NOTE — Discharge Instructions (Signed)
Use polytrim as prescribed and to completion Use OTC systane or genteal gel eye drops at night as needed for symptomatic relief Use OTC ibuprofen or tylenol as needed for pain relief Return here or follow up with ophthamolgy if symptoms persists or worsen such as fever, chills, redness, swelling, eye pain, painful eye movements, vision changes, etc..Marland Kitchen

## 2022-08-30 ENCOUNTER — Ambulatory Visit
Admission: RE | Admit: 2022-08-30 | Discharge: 2022-08-30 | Disposition: A | Discharge status: Home / Self Care | Payer: BC Managed Care – PPO | Source: Ambulatory Visit | Attending: Nurse Practitioner | Admitting: Nurse Practitioner

## 2022-08-30 VITALS — BP 141/87 | HR 97 | Temp 97.9°F | Resp 20

## 2022-08-30 DIAGNOSIS — L02212 Cutaneous abscess of back [any part, except buttock]: Secondary | ICD-10-CM | POA: Diagnosis not present

## 2022-08-30 NOTE — Discharge Instructions (Addendum)
Please keep the dressing on for 12 hours, then take it off and shower like normal After cleaning the area twice daily, apply a clean dressing twice daily and secure it with tape You can use warm compresses on the area to help allow drainage Follow up with General Surgery if the area closes up and becomes painful or recurs

## 2022-08-30 NOTE — ED Notes (Signed)
Applied a gauze and a tegaderm to patients abscess on her upper left back shoulder.

## 2022-08-30 NOTE — ED Provider Notes (Signed)
[facility]    CSN: [id number] Arrival date & time: 08/30/22  1536      History   Chief Complaint Chief Complaint  Patient presents with   Abscess    Entered by patient    HPI [name] is a 47 y.o. female.   Patient presents today for recurrent cyst of her upper left back.  Reports this area has been coming and going for the past year and she has been trying to get her husband to "pop" it.  Reports in the past 24 hours, it has become itchy and per patient, this usually happens when "it gets inflamed."  She denies redness or tenderness, active drainage, and fevers, nausea/vomiting since cyst recurred.       Past Medical History:  Diagnosis Date   Gastritis    Headache    Neck mass    left    Patient Active Problem List   Diagnosis Date Noted   Hematuria 04/06/2013    Past Surgical History:  Procedure Laterality Date   ABDOMINAL HYSTERECTOMY     BLADDER SUSPENSION     ENDOMETRIAL ABLATION     PAROTIDECTOMY Left 12/09/2016   Procedure: LEFT LATERAL PAROTIDECTOMY;  Surgeon: Newman Pies, MD;  Location: [facility];  Service: ENT;  Laterality: Left;  Needs RNFA   TUBAL LIGATION      OB History   No obstetric history on file.      Home Medications    Prior to Admission medications   Medication Sig Start Date End Date Taking? Authorizing Provider  diphenhydrAMINE (BENADRYL) 25 mg capsule Take 1 capsule (25 mg total) by mouth every 8 (eight) hours as needed for allergies. 09/10/15   Horton, Mayer Masker, MD  EPINEPHrine 0.3 mg/0.3 mL IJ SOAJ injection Inject 0.3 mLs (0.3 mg total) into the muscle once as needed (anaphylaxis). 09/10/15   Horton, Mayer Masker, MD  famotidine (PEPCID) 20 MG tablet Take 1 tablet (20 mg total) by mouth daily. 09/10/15   Horton, Mayer Masker, MD  oxyCODONE-acetaminophen (ROXICET) 5-325 MG tablet Take 1 tablet by mouth every 4 (four) hours as needed for severe pain. 12/09/16   Newman Pies, MD  triamcinolone cream (KENALOG)  0.1 % Apply 1 application topically 2 (two) times daily. 09/03/16   Merlyn Albert, MD    Family History Family History  Family history unknown: Yes    Social History Social History   Tobacco Use   Smoking status: Every Day    Packs/day: .5    Types: Cigarettes   Smokeless tobacco: Never  Substance Use Topics   Alcohol use: Yes    Comment: social   Drug use: No     Allergies   Peanut-containing drug products and Doxycycline   Review of Systems Review of Systems Per HPI  Physical Exam Triage Vital Signs ED Triage Vitals  Enc Vitals Group     BP 08/30/22 1543 (!) 141/87     Pulse Rate 08/30/22 1543 97     Resp 08/30/22 1543 20     Temp 08/30/22 1543 97.9 F (36.6 C)     Temp Source 08/30/22 1543 Oral     SpO2 08/30/22 1543 97 %     Weight --      Height --      Head Circumference --      Peak Flow --      Pain Score 08/30/22 1546 0     Pain Loc --  Pain Edu? --      Excl. in GC? --    No data found.  Updated Vital Signs BP (!) 141/87 (BP Location: Right Arm)   Pulse 97   Temp 97.9 F (36.6 C) (Oral)   Resp 20   SpO2 97%   Visual Acuity Right Eye Distance:   Left Eye Distance:   Bilateral Distance:    Right Eye Near:   Left Eye Near:    Bilateral Near:     Physical Exam Vitals and nursing note reviewed.  Constitutional:      General: She is not in acute distress.    Appearance: Normal appearance. She is not toxic-appearing.  HENT:     Mouth/Throat:     Mouth: Mucous membranes are moist.     Pharynx: Oropharynx is clear.  Pulmonary:     Effort: Pulmonary effort is normal. No respiratory distress.  Skin:    General: Skin is warm and dry.     Capillary Refill: Capillary refill takes less than 2 seconds.          Comments: Approximately 1.5 x 1 cm flesh-colored cyst to left upper back in approximately area marked.  No fluctuance, erythema, tenderness to palpation, active drainage.  There does appear to be a pinpoint hyperpigmented  area.  Neurological:     Mental Status: She is alert and oriented to person, place, and time.  Psychiatric:        Behavior: Behavior is cooperative.      UC Treatments / Results  Labs (all labs ordered are listed, but only abnormal results are displayed) Labs Reviewed - No data to display  EKG   Radiology No results found.  Procedures Incision and Drainage  Date/Time: 08/30/2022 4:23 PM  Performed by: Valentino Nose, NP Authorized by: Valentino Nose, NP   Consent:    Consent obtained:  Verbal   Consent given by:  Patient   Risks, benefits, and alternatives were discussed: yes     Risks discussed:  Bleeding, incomplete drainage, pain, infection and damage to other organs   Alternatives discussed:  Alternative treatment Universal protocol:    Procedure explained and questions answered to patient or proxy's satisfaction: yes     Patient identity confirmed:  Verbally with patient Location:    Type:  Cyst   Size:  1.5 x 1 cm   Location:  Trunk   Trunk location:  Back Pre-procedure details:    Skin preparation:  Antiseptic wash Anesthesia:    Anesthesia method:  Local infiltration   Local anesthetic:  Lidocaine 2% w/o epi Procedure type:    Complexity:  Simple Procedure details:    Ultrasound guidance: no     Needle aspiration: no     Incision types:  Stab incision and single straight   Incision depth:  Dermal   Wound management:  Probed and deloculated and irrigated with saline   Drainage:  Bloody   Drainage amount:  Moderate   Wound treatment:  Wound left open   Packing materials:  None Post-procedure details:    Procedure completion:  Tolerated well, no immediate complications  (including critical care time)  Medications Ordered in UC Medications - No data to display  Initial Impression / Assessment and Plan / UC Course  I have reviewed the triage vital signs and the nursing notes.  Pertinent labs & imaging results that were available during  my care of the patient were reviewed by me and considered in my medical decision  making (see chart for details).   Patient is well-appearing, normotensive, afebrile, not tachycardic, not tachypneic, oxygenating well on room air.    1. Abscess of back Discussed with patient that this likely needs surgical removal by general surgery; I&D is not recommended since it is not fluctuant and not painful/red Patient desires I&D for relief of cyst; states she does not have time to follow-up with general surgeon I&D of cyst as above Wound care discussed Will hold off on empiric antibiotics given no active infection Recommended following up if signs or symptoms of infection develop Recommended follow-up with general surgeon if this area recurs   The patient was given the opportunity to ask questions.  All questions answered to their satisfaction.  The patient is in agreement to this plan.    Final Clinical Impressions(s) / UC Diagnoses   Final diagnoses:  Abscess of back     Discharge Instructions      Please keep the dressing on for 12 hours, then take it off and shower like normal After cleaning the area twice daily, apply a clean dressing twice daily and secure it with tape You can use warm compresses on the area to help allow drainage Follow up with General Surgery if the area closes up and becomes painful or recurs    ED Prescriptions   None    PDMP not reviewed this encounter.   Valentino Nose, NP 08/30/22 1625

## 2022-08-30 NOTE — ED Triage Notes (Signed)
Pt reports she has an abscess on her upper back that has been there for a year. She states it started itching which usually means it will soon be inflamed again. Pt reports no fever. Applied ichthammol ointment helped when area was inflamed.
